# Patient Record
Sex: Female | Born: 1935 | Race: White | Hispanic: No | Smoking: Former smoker
Health system: Southern US, Community
[De-identification: ages and names within clinical notes are randomized; demographics above are authoritative.]

## PROBLEM LIST (undated history)

## (undated) DIAGNOSIS — R0602 Shortness of breath: Secondary | ICD-10-CM

## (undated) DIAGNOSIS — D689 Coagulation defect, unspecified: Secondary | ICD-10-CM

## (undated) DIAGNOSIS — I80299 Phlebitis and thrombophlebitis of other deep vessels of unspecified lower extremity: Secondary | ICD-10-CM

## (undated) DIAGNOSIS — R609 Edema, unspecified: Secondary | ICD-10-CM

## (undated) DIAGNOSIS — Z5189 Encounter for other specified aftercare: Secondary | ICD-10-CM

## (undated) DIAGNOSIS — F32A Depression, unspecified: Secondary | ICD-10-CM

## (undated) DIAGNOSIS — K219 Gastro-esophageal reflux disease without esophagitis: Secondary | ICD-10-CM

## (undated) DIAGNOSIS — M8448XA Pathological fracture, other site, initial encounter for fracture: Secondary | ICD-10-CM

## (undated) DIAGNOSIS — M6281 Muscle weakness (generalized): Secondary | ICD-10-CM

## (undated) DIAGNOSIS — S22009A Unspecified fracture of unspecified thoracic vertebra, initial encounter for closed fracture: Secondary | ICD-10-CM

## (undated) DIAGNOSIS — R9431 Abnormal electrocardiogram [ECG] [EKG]: Secondary | ICD-10-CM

## (undated) DIAGNOSIS — G20 Parkinson's disease: Secondary | ICD-10-CM

## (undated) DIAGNOSIS — IMO0001 Reserved for inherently not codable concepts without codable children: Secondary | ICD-10-CM

## (undated) DIAGNOSIS — T790XXA Air embolism (traumatic), initial encounter: Secondary | ICD-10-CM

## (undated) DIAGNOSIS — K2100 Gastro-esophageal reflux disease with esophagitis, without bleeding: Secondary | ICD-10-CM

## (undated) DIAGNOSIS — R21 Rash and other nonspecific skin eruption: Secondary | ICD-10-CM

## (undated) DIAGNOSIS — J449 Chronic obstructive pulmonary disease, unspecified: Secondary | ICD-10-CM

## (undated) DIAGNOSIS — R1312 Dysphagia, oropharyngeal phase: Secondary | ICD-10-CM

## (undated) DIAGNOSIS — M810 Age-related osteoporosis without current pathological fracture: Secondary | ICD-10-CM

## (undated) DIAGNOSIS — F329 Major depressive disorder, single episode, unspecified: Secondary | ICD-10-CM

## (undated) DIAGNOSIS — I10 Essential (primary) hypertension: Secondary | ICD-10-CM

## (undated) DIAGNOSIS — S2249XA Multiple fractures of ribs, unspecified side, initial encounter for closed fracture: Secondary | ICD-10-CM

## (undated) DIAGNOSIS — N950 Postmenopausal bleeding: Secondary | ICD-10-CM

## (undated) DIAGNOSIS — E785 Hyperlipidemia, unspecified: Secondary | ICD-10-CM

## (undated) DIAGNOSIS — R0902 Hypoxemia: Secondary | ICD-10-CM

## (undated) DIAGNOSIS — R5381 Other malaise: Secondary | ICD-10-CM

## (undated) DIAGNOSIS — K4090 Unilateral inguinal hernia, without obstruction or gangrene, not specified as recurrent: Secondary | ICD-10-CM

## (undated) DIAGNOSIS — S2239XA Fracture of one rib, unspecified side, initial encounter for closed fracture: Secondary | ICD-10-CM

## (undated) DIAGNOSIS — G4700 Insomnia, unspecified: Secondary | ICD-10-CM

## (undated) DIAGNOSIS — K6389 Other specified diseases of intestine: Secondary | ICD-10-CM

## (undated) DIAGNOSIS — C189 Malignant neoplasm of colon, unspecified: Secondary | ICD-10-CM

## (undated) DIAGNOSIS — J189 Pneumonia, unspecified organism: Secondary | ICD-10-CM

## (undated) DIAGNOSIS — T4145XA Adverse effect of unspecified anesthetic, initial encounter: Secondary | ICD-10-CM

## (undated) DIAGNOSIS — H5450 Low vision, one eye, unspecified eye: Secondary | ICD-10-CM

## (undated) DIAGNOSIS — G629 Polyneuropathy, unspecified: Secondary | ICD-10-CM

## (undated) DIAGNOSIS — R269 Unspecified abnormalities of gait and mobility: Secondary | ICD-10-CM

## (undated) DIAGNOSIS — T8859XA Other complications of anesthesia, initial encounter: Secondary | ICD-10-CM

## (undated) DIAGNOSIS — M1990 Unspecified osteoarthritis, unspecified site: Secondary | ICD-10-CM

## (undated) DIAGNOSIS — K5900 Constipation, unspecified: Secondary | ICD-10-CM

## (undated) DIAGNOSIS — R32 Unspecified urinary incontinence: Secondary | ICD-10-CM

## (undated) DIAGNOSIS — E039 Hypothyroidism, unspecified: Secondary | ICD-10-CM

## (undated) DIAGNOSIS — I2699 Other pulmonary embolism without acute cor pulmonale: Secondary | ICD-10-CM

## (undated) DIAGNOSIS — N824 Other female intestinal-genital tract fistulae: Secondary | ICD-10-CM

## (undated) DIAGNOSIS — R5383 Other fatigue: Secondary | ICD-10-CM

## (undated) DIAGNOSIS — Z7901 Long term (current) use of anticoagulants: Secondary | ICD-10-CM

## (undated) DIAGNOSIS — R233 Spontaneous ecchymoses: Secondary | ICD-10-CM

## (undated) DIAGNOSIS — E079 Disorder of thyroid, unspecified: Secondary | ICD-10-CM

## (undated) DIAGNOSIS — M25569 Pain in unspecified knee: Secondary | ICD-10-CM

## (undated) DIAGNOSIS — G20A1 Parkinson's disease without dyskinesia, without mention of fluctuations: Secondary | ICD-10-CM

## (undated) DIAGNOSIS — Z79899 Other long term (current) drug therapy: Secondary | ICD-10-CM

## (undated) DIAGNOSIS — J301 Allergic rhinitis due to pollen: Secondary | ICD-10-CM

## (undated) DIAGNOSIS — M461 Sacroiliitis, not elsewhere classified: Secondary | ICD-10-CM

## (undated) DIAGNOSIS — I509 Heart failure, unspecified: Secondary | ICD-10-CM

## (undated) DIAGNOSIS — I4891 Unspecified atrial fibrillation: Secondary | ICD-10-CM

## (undated) DIAGNOSIS — M40209 Unspecified kyphosis, site unspecified: Secondary | ICD-10-CM

## (undated) HISTORY — DX: Other long term (current) drug therapy: Z79.899

## (undated) HISTORY — PX: RETINAL TEAR REPAIR CRYOTHERAPY: SHX5304

## (undated) HISTORY — DX: Postmenopausal bleeding: N95.0

## (undated) HISTORY — DX: Other specified diseases of intestine: K63.89

## (undated) HISTORY — DX: Malignant neoplasm of colon, unspecified: C18.9

## (undated) HISTORY — PX: NASAL SINUS SURGERY: SHX719

## (undated) HISTORY — PX: PLANTAR FASCIA SURGERY: SHX746

## (undated) HISTORY — PX: PRESSURE ULCER DEBRIDEMENT: SHX750

## (undated) HISTORY — PX: TONSILLECTOMY AND ADENOIDECTOMY: SUR1326

## (undated) HISTORY — PX: BACK SURGERY: SHX140

## (undated) HISTORY — DX: Other female intestinal-genital tract fistulae: N82.4

## (undated) HISTORY — PX: CATARACT EXTRACTION, BILATERAL: SHX1313

---

## 1968-06-28 HISTORY — PX: BREAST SURGERY: SHX581

## 1998-03-06 ENCOUNTER — Other Ambulatory Visit: Admission: RE | Admit: 1998-03-06 | Discharge: 1998-03-06 | Payer: Self-pay | Admitting: Internal Medicine

## 1999-06-29 HISTORY — PX: BLADDER SUSPENSION: SHX72

## 1999-10-04 ENCOUNTER — Encounter: Payer: Self-pay | Admitting: Emergency Medicine

## 1999-10-04 ENCOUNTER — Emergency Department (HOSPITAL_COMMUNITY): Admission: EM | Admit: 1999-10-04 | Discharge: 1999-10-04 | Payer: Self-pay | Admitting: Emergency Medicine

## 1999-10-12 ENCOUNTER — Encounter: Payer: Self-pay | Admitting: Internal Medicine

## 1999-10-12 ENCOUNTER — Ambulatory Visit (HOSPITAL_COMMUNITY): Admission: RE | Admit: 1999-10-12 | Discharge: 1999-10-12 | Payer: Self-pay | Admitting: Internal Medicine

## 2000-01-25 ENCOUNTER — Inpatient Hospital Stay (HOSPITAL_COMMUNITY): Admission: RE | Admit: 2000-01-25 | Discharge: 2000-01-30 | Payer: Self-pay | Admitting: Urology

## 2000-08-01 ENCOUNTER — Inpatient Hospital Stay (HOSPITAL_COMMUNITY): Admission: AD | Admit: 2000-08-01 | Discharge: 2000-08-04 | Payer: Self-pay | Admitting: Internal Medicine

## 2001-03-02 ENCOUNTER — Encounter (INDEPENDENT_AMBULATORY_CARE_PROVIDER_SITE_OTHER): Payer: Self-pay | Admitting: Specialist

## 2001-03-02 ENCOUNTER — Ambulatory Visit (HOSPITAL_COMMUNITY): Admission: RE | Admit: 2001-03-02 | Discharge: 2001-03-02 | Payer: Self-pay | Admitting: *Deleted

## 2002-01-18 ENCOUNTER — Encounter: Admission: RE | Admit: 2002-01-18 | Discharge: 2002-01-18 | Payer: Self-pay | Admitting: Otolaryngology

## 2002-01-18 ENCOUNTER — Encounter: Payer: Self-pay | Admitting: Otolaryngology

## 2002-01-19 ENCOUNTER — Ambulatory Visit (HOSPITAL_BASED_OUTPATIENT_CLINIC_OR_DEPARTMENT_OTHER): Admission: RE | Admit: 2002-01-19 | Discharge: 2002-01-19 | Payer: Self-pay | Admitting: Otolaryngology

## 2002-01-19 ENCOUNTER — Encounter (INDEPENDENT_AMBULATORY_CARE_PROVIDER_SITE_OTHER): Payer: Self-pay | Admitting: *Deleted

## 2002-05-12 ENCOUNTER — Inpatient Hospital Stay (HOSPITAL_COMMUNITY): Admission: EM | Admit: 2002-05-12 | Discharge: 2002-05-13 | Payer: Self-pay | Admitting: Emergency Medicine

## 2002-06-28 HISTORY — PX: APPENDECTOMY: SHX54

## 2002-10-15 ENCOUNTER — Inpatient Hospital Stay (HOSPITAL_COMMUNITY): Admission: RE | Admit: 2002-10-15 | Discharge: 2002-10-25 | Payer: Self-pay | Admitting: Internal Medicine

## 2002-10-15 ENCOUNTER — Encounter (HOSPITAL_BASED_OUTPATIENT_CLINIC_OR_DEPARTMENT_OTHER): Payer: Self-pay | Admitting: Internal Medicine

## 2002-10-16 ENCOUNTER — Encounter (INDEPENDENT_AMBULATORY_CARE_PROVIDER_SITE_OTHER): Payer: Self-pay | Admitting: Specialist

## 2003-03-12 ENCOUNTER — Emergency Department (HOSPITAL_COMMUNITY): Admission: EM | Admit: 2003-03-12 | Discharge: 2003-03-12 | Payer: Self-pay | Admitting: Emergency Medicine

## 2003-03-12 ENCOUNTER — Encounter: Payer: Self-pay | Admitting: Emergency Medicine

## 2003-09-18 ENCOUNTER — Ambulatory Visit (HOSPITAL_COMMUNITY): Admission: RE | Admit: 2003-09-18 | Discharge: 2003-09-18 | Payer: Self-pay | Admitting: Internal Medicine

## 2003-10-01 ENCOUNTER — Ambulatory Visit (HOSPITAL_COMMUNITY): Admission: RE | Admit: 2003-10-01 | Discharge: 2003-10-01 | Payer: Self-pay | Admitting: Internal Medicine

## 2004-10-06 ENCOUNTER — Ambulatory Visit (HOSPITAL_COMMUNITY): Admission: RE | Admit: 2004-10-06 | Discharge: 2004-10-06 | Payer: Self-pay | Admitting: Orthopedic Surgery

## 2005-11-23 ENCOUNTER — Ambulatory Visit (HOSPITAL_COMMUNITY): Admission: RE | Admit: 2005-11-23 | Discharge: 2005-11-23 | Payer: Self-pay | Admitting: Internal Medicine

## 2006-10-05 ENCOUNTER — Ambulatory Visit (HOSPITAL_COMMUNITY): Admission: RE | Admit: 2006-10-05 | Discharge: 2006-10-05 | Payer: Self-pay | Admitting: Internal Medicine

## 2006-10-12 ENCOUNTER — Encounter: Payer: Self-pay | Admitting: Internal Medicine

## 2006-10-13 ENCOUNTER — Ambulatory Visit (HOSPITAL_COMMUNITY): Admission: RE | Admit: 2006-10-13 | Discharge: 2006-10-13 | Payer: Self-pay | Admitting: Interventional Radiology

## 2006-10-13 ENCOUNTER — Encounter (INDEPENDENT_AMBULATORY_CARE_PROVIDER_SITE_OTHER): Payer: Self-pay | Admitting: *Deleted

## 2006-10-14 ENCOUNTER — Inpatient Hospital Stay (HOSPITAL_COMMUNITY): Admission: EM | Admit: 2006-10-14 | Discharge: 2006-10-21 | Payer: Self-pay | Admitting: Emergency Medicine

## 2006-11-03 ENCOUNTER — Encounter: Payer: Self-pay | Admitting: Interventional Radiology

## 2007-02-08 ENCOUNTER — Ambulatory Visit: Admission: RE | Admit: 2007-02-08 | Discharge: 2007-02-08 | Payer: Self-pay | Admitting: Internal Medicine

## 2008-01-04 ENCOUNTER — Ambulatory Visit (HOSPITAL_COMMUNITY): Admission: RE | Admit: 2008-01-04 | Discharge: 2008-01-04 | Payer: Self-pay | Admitting: Internal Medicine

## 2008-02-01 ENCOUNTER — Encounter: Admission: RE | Admit: 2008-02-01 | Discharge: 2008-02-01 | Payer: Self-pay | Admitting: Neurology

## 2008-11-26 DIAGNOSIS — I2699 Other pulmonary embolism without acute cor pulmonale: Secondary | ICD-10-CM

## 2008-11-26 HISTORY — DX: Other pulmonary embolism without acute cor pulmonale: I26.99

## 2008-12-02 ENCOUNTER — Inpatient Hospital Stay (HOSPITAL_COMMUNITY): Admission: EM | Admit: 2008-12-02 | Discharge: 2008-12-10 | Payer: Self-pay | Admitting: Emergency Medicine

## 2008-12-02 ENCOUNTER — Ambulatory Visit: Payer: Self-pay | Admitting: Cardiology

## 2008-12-03 ENCOUNTER — Encounter (INDEPENDENT_AMBULATORY_CARE_PROVIDER_SITE_OTHER): Payer: Self-pay | Admitting: Internal Medicine

## 2008-12-03 ENCOUNTER — Ambulatory Visit: Payer: Self-pay | Admitting: Vascular Surgery

## 2008-12-21 ENCOUNTER — Emergency Department (HOSPITAL_COMMUNITY): Admission: EM | Admit: 2008-12-21 | Discharge: 2008-12-22 | Payer: Self-pay | Admitting: Emergency Medicine

## 2009-10-09 ENCOUNTER — Inpatient Hospital Stay (HOSPITAL_COMMUNITY): Admission: EM | Admit: 2009-10-09 | Discharge: 2009-10-16 | Payer: Self-pay | Admitting: Emergency Medicine

## 2009-10-11 ENCOUNTER — Encounter: Payer: Self-pay | Admitting: Internal Medicine

## 2009-10-13 ENCOUNTER — Encounter (INDEPENDENT_AMBULATORY_CARE_PROVIDER_SITE_OTHER): Payer: Self-pay | Admitting: Internal Medicine

## 2009-10-13 DIAGNOSIS — M40209 Unspecified kyphosis, site unspecified: Secondary | ICD-10-CM

## 2009-10-13 HISTORY — DX: Unspecified kyphosis, site unspecified: M40.209

## 2009-10-17 DIAGNOSIS — R5381 Other malaise: Secondary | ICD-10-CM

## 2009-10-17 HISTORY — DX: Other malaise: R53.81

## 2009-12-06 IMAGING — CT CT HEAD W/O CM
1 of 2 series · 16 of 30 positions shown, 20 images · non-contrast
Comparison: 02/01/2008

CLINICAL DATA: Fall, hit head.

CT HEAD WITHOUT CONTRAST
TECHNIQUE: Contiguous axial images were obtained from the base of
the skull through the vertex without contrast.

[Series 3: recon 2: brain · axial · 0.49mm/px · z∈[+135,+265]mm · 16 of 56 slices shown, 20 images]
[im 3/56  brain]
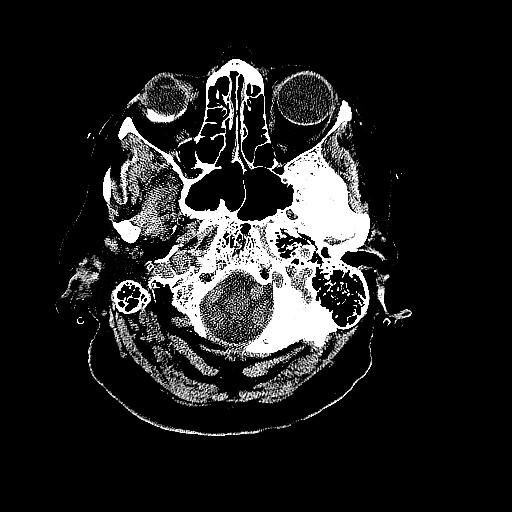
[im 3/56  bone]
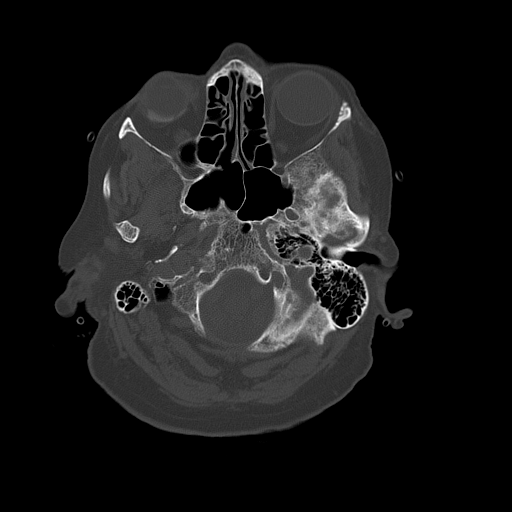
[im 6/56  brain]
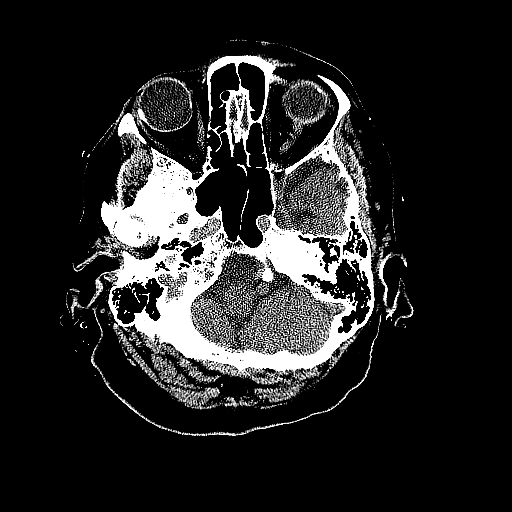
[im 9/56  brain]
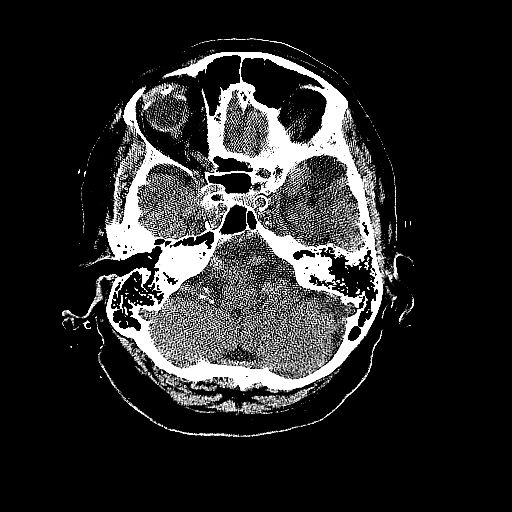
[im 12/56  brain]
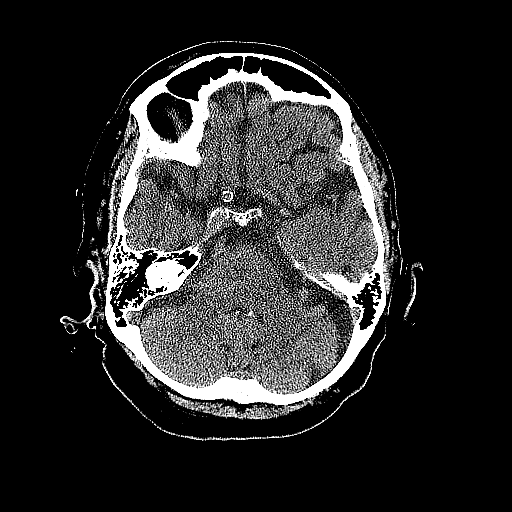
[im 18/56  brain]
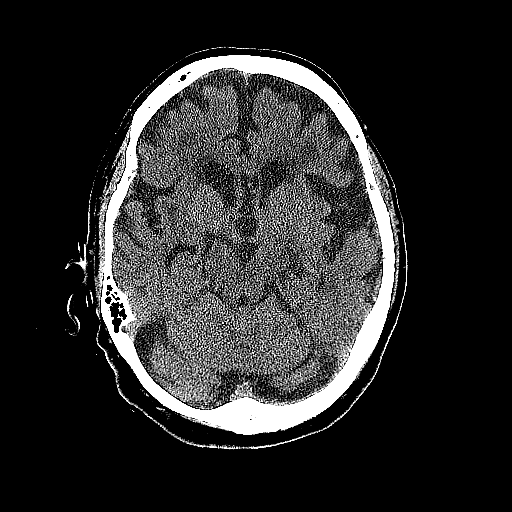
[im 18/56  bone]
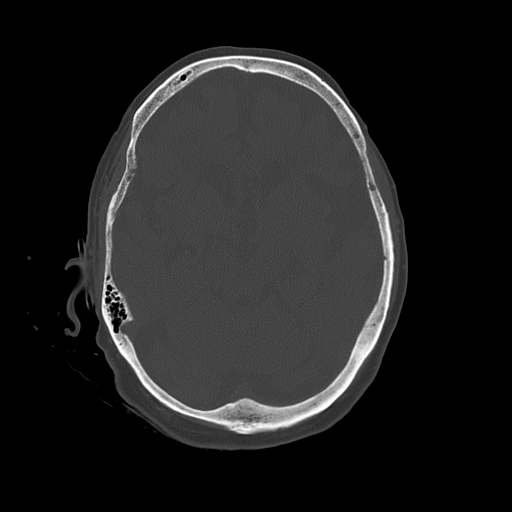
[im 21/56  brain]
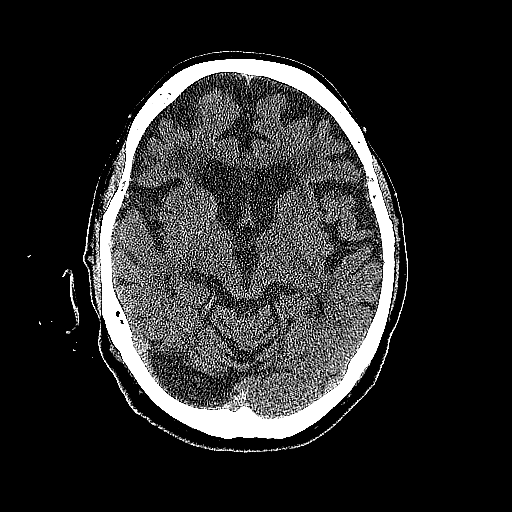
[im 24/56  brain]
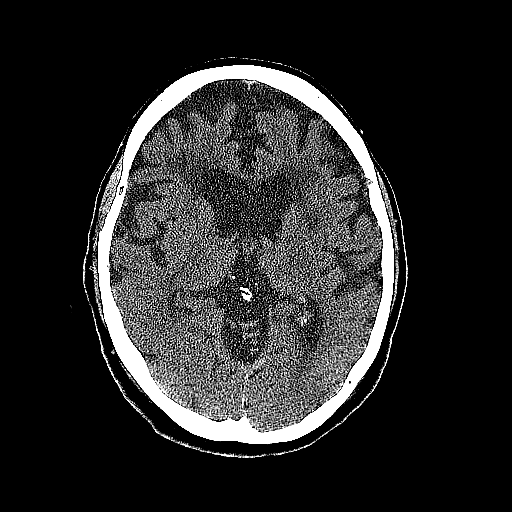
[im 27/56  brain]
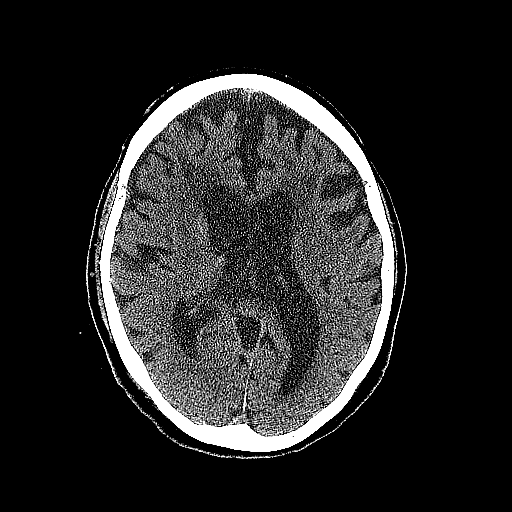
[im 29/56  brain]
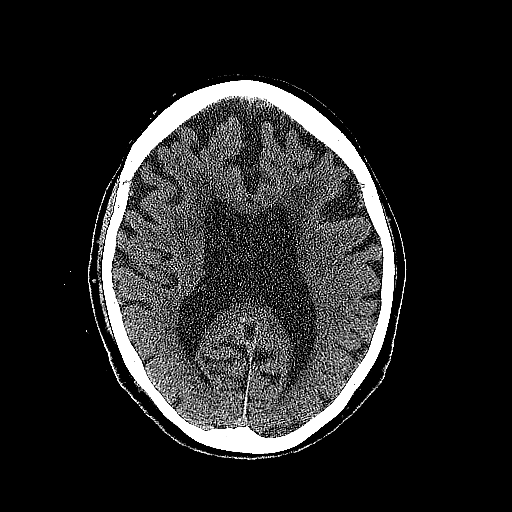
[im 29/56  bone]
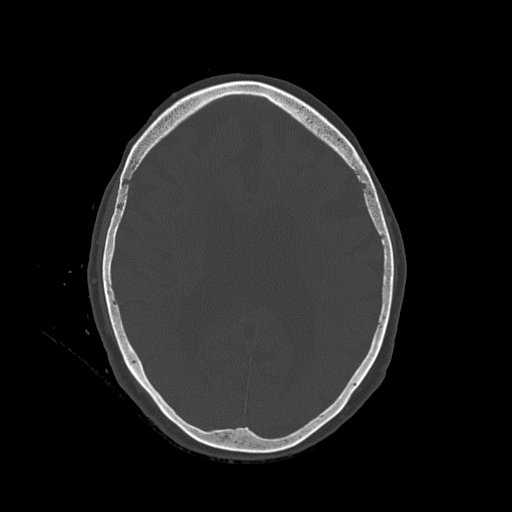
[im 32/56  brain]
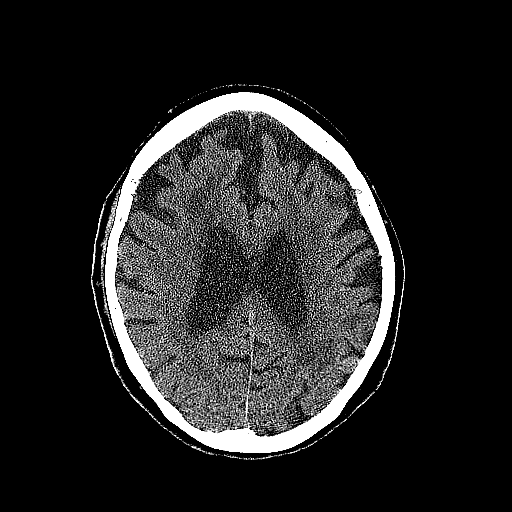
[im 35/56  brain]
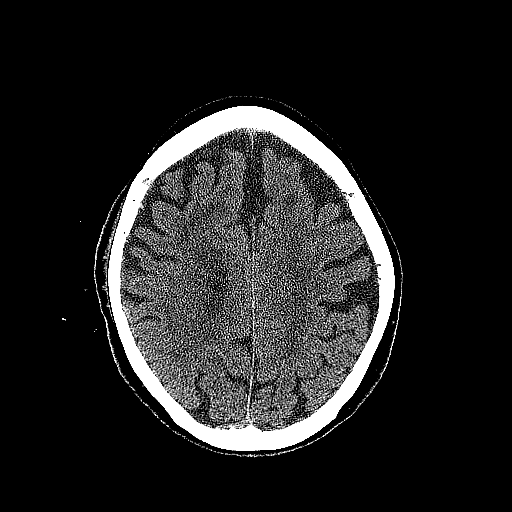
[im 38/56  brain]
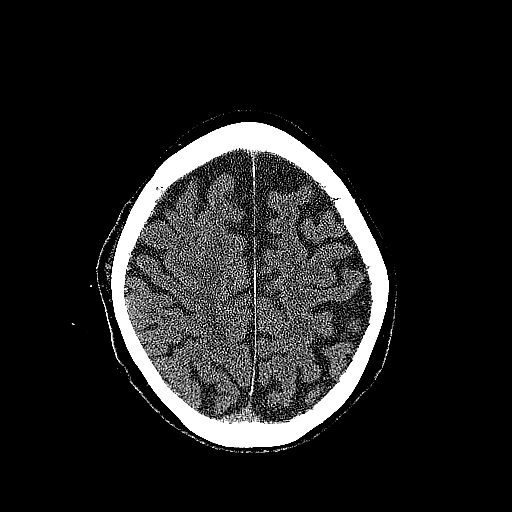
[im 44/56  brain]
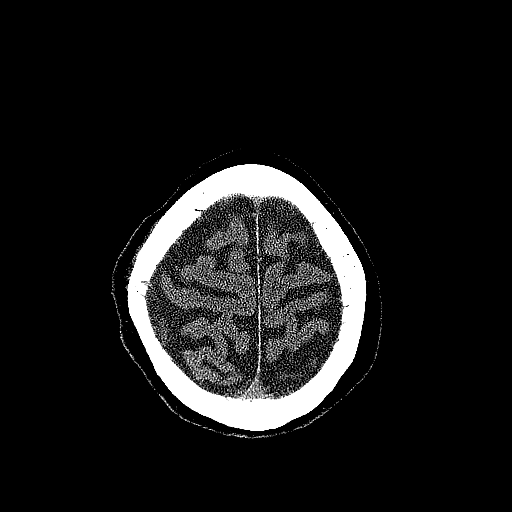
[im 44/56  bone]
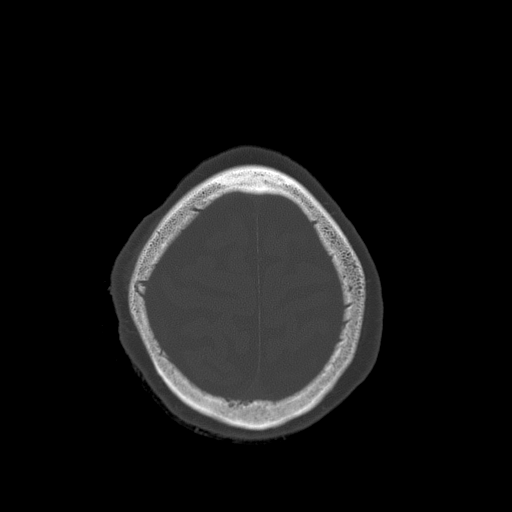
[im 47/56  brain]
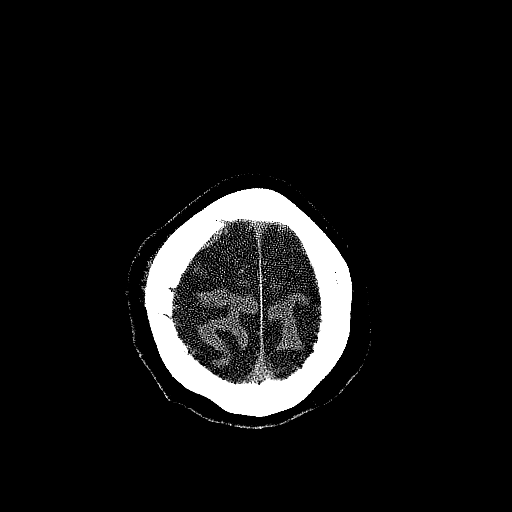
[im 50/56  brain]
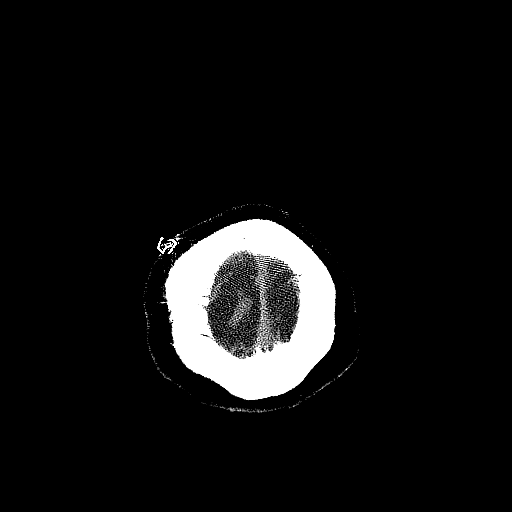
[im 53/56  brain]
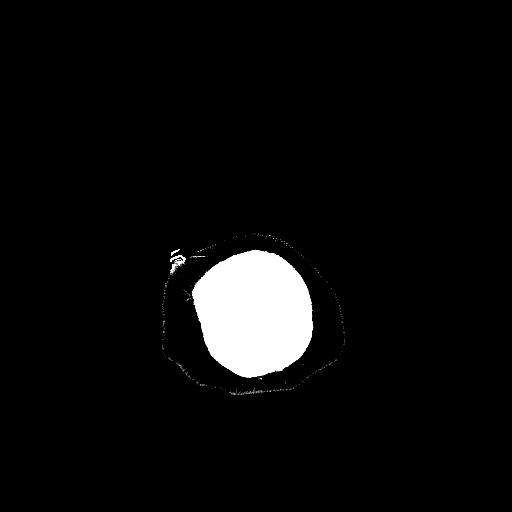

[16 of 30 positions shown; findings below may reference images not displayed]

FINDINGS: Severe atrophy and chronic small vessel disease changes
throughout the deep white matter.  Associated ventriculomegaly. No
acute intracranial abnormality.  Specifically, no hemorrhage,
hydrocephalus, mass lesion, acute infarction, or significant
intracranial injury.  No acute calvarial abnormality.
IMPRESSION: No acute intracranial abnormality.

Severe atrophy, chronic small vessel disease.

## 2010-09-15 LAB — COMPREHENSIVE METABOLIC PANEL
ALT: <8 U/L (ref 0–35)
Alkaline Phosphatase: 57 U/L (ref 39–117)
BUN: 17 mg/dL (ref 6–23)
CO2: 27 mEq/L (ref 19–32)
Calcium: 8.9 mg/dL (ref 8.4–10.5)
GFR calc non Af Amer: 39 mL/min — ABNORMAL LOW (ref >60)
Glucose, Bld: 109 mg/dL — ABNORMAL HIGH (ref 70–99)
Potassium: 5 mEq/L (ref 3.5–5.1)
Total Protein: 6.2 g/dL (ref 6.0–8.3)

## 2010-09-15 LAB — CARDIAC PANEL(CRET KIN+CKTOT+MB+TROPI)
CK, MB: 2.6 ng/mL (ref 0.3–4.0)
CK, MB: 3 ng/mL (ref 0.3–4.0)
Relative Index: 1.5 (ref 0.0–2.5)
Total CK: 151 U/L (ref 7–177)
Total CK: 177 U/L (ref 7–177)
Troponin I: 0.03 ng/mL (ref 0.00–0.06)

## 2010-09-15 LAB — DIFFERENTIAL
Basophils Absolute: 0 10*3/uL (ref 0.0–0.1)
Basophils Relative: 0 % (ref 0–1)
Lymphocytes Relative: 9 % — ABNORMAL LOW (ref 12–46)
Monocytes Relative: 1 % — ABNORMAL LOW (ref 3–12)
Neutro Abs: 4.9 10*3/uL (ref 1.7–7.7)
Neutrophils Relative %: 90 % — ABNORMAL HIGH (ref 43–77)

## 2010-09-15 LAB — PROTIME-INR
INR: 1.12 (ref 0.00–1.49)
INR: 1.17 (ref 0.00–1.49)
INR: 1.29 (ref 0.00–1.49)
INR: 3.14 — ABNORMAL HIGH (ref 0.00–1.49)
Prothrombin Time: 14.3 seconds (ref 11.6–15.2)
Prothrombin Time: 16 seconds — ABNORMAL HIGH (ref 11.6–15.2)
Prothrombin Time: 22.2 seconds — ABNORMAL HIGH (ref 11.6–15.2)
Prothrombin Time: 32 seconds — ABNORMAL HIGH (ref 11.6–15.2)

## 2010-09-15 LAB — CBC
HCT: 33.4 % — ABNORMAL LOW (ref 36.0–46.0)
HCT: 35.2 % — ABNORMAL LOW (ref 36.0–46.0)
Hemoglobin: 12 g/dL (ref 12.0–15.0)
MCHC: 33.4 g/dL (ref 30.0–36.0)
MCHC: 33.8 g/dL (ref 30.0–36.0)
MCHC: 34 g/dL (ref 30.0–36.0)
MCHC: 34.4 g/dL (ref 30.0–36.0)
MCV: 82.6 fL (ref 78.0–100.0)
MCV: 82.9 fL (ref 78.0–100.0)
Platelets: 143 10*3/uL — ABNORMAL LOW (ref 150–400)
Platelets: 222 10*3/uL (ref 150–400)
RBC: 3.87 MIL/uL (ref 3.87–5.11)
RBC: 3.97 MIL/uL (ref 3.87–5.11)
RBC: 4.25 MIL/uL (ref 3.87–5.11)
RDW: 17.3 % — ABNORMAL HIGH (ref 11.5–15.5)
RDW: 17.3 % — ABNORMAL HIGH (ref 11.5–15.5)
RDW: 17.6 % — ABNORMAL HIGH (ref 11.5–15.5)
RDW: 17.6 % — ABNORMAL HIGH (ref 11.5–15.5)
RDW: 18 % — ABNORMAL HIGH (ref 11.5–15.5)
WBC: 6.8 10*3/uL (ref 4.0–10.5)
WBC: 9.7 10*3/uL (ref 4.0–10.5)

## 2010-09-15 LAB — BASIC METABOLIC PANEL
BUN: 27 mg/dL — ABNORMAL HIGH (ref 6–23)
CO2: 28 mEq/L (ref 19–32)
Calcium: 8.4 mg/dL (ref 8.4–10.5)
Calcium: 8.8 mg/dL (ref 8.4–10.5)
Creatinine, Ser: 1.06 mg/dL (ref 0.4–1.2)
GFR calc Af Amer: 59 mL/min — ABNORMAL LOW (ref >60)
GFR calc non Af Amer: 41 mL/min — ABNORMAL LOW (ref >60)
GFR calc non Af Amer: 49 mL/min — ABNORMAL LOW (ref >60)
Glucose, Bld: 106 mg/dL — ABNORMAL HIGH (ref 70–99)
Sodium: 138 mEq/L (ref 135–145)

## 2010-09-15 LAB — BLOOD GAS, ARTERIAL
Acid-Base Excess: 0.9 mmol/L (ref 0.0–2.0)
Bicarbonate: 25.2 mEq/L — ABNORMAL HIGH (ref 20.0–24.0)
O2 Saturation: 98.4 %
Patient temperature: 98.6
TCO2: 26.5 mmol/L (ref 0–100)
pO2, Arterial: 80.9 mmHg (ref 80.0–100.0)

## 2010-09-16 LAB — DIFFERENTIAL
Basophils Absolute: 0.1 10*3/uL (ref 0.0–0.1)
Lymphocytes Relative: 9 % — ABNORMAL LOW (ref 12–46)
Monocytes Absolute: 0.5 10*3/uL (ref 0.1–1.0)
Neutro Abs: 12.3 10*3/uL — ABNORMAL HIGH (ref 1.7–7.7)

## 2010-09-16 LAB — BASIC METABOLIC PANEL
Calcium: 9.1 mg/dL (ref 8.4–10.5)
GFR calc non Af Amer: 38 mL/min — ABNORMAL LOW (ref >60)
Glucose, Bld: 134 mg/dL — ABNORMAL HIGH (ref 70–99)
Sodium: 144 mEq/L (ref 135–145)

## 2010-09-16 LAB — URINALYSIS, ROUTINE W REFLEX MICROSCOPIC
Glucose, UA: NEGATIVE mg/dL
Ketones, ur: NEGATIVE mg/dL
Nitrite: NEGATIVE
Protein, ur: NEGATIVE mg/dL
Urobilinogen, UA: 0.2 mg/dL (ref 0.0–1.0)

## 2010-09-16 LAB — BRAIN NATRIURETIC PEPTIDE: Pro B Natriuretic peptide (BNP): 58.3 pg/mL (ref 0.0–100.0)

## 2010-09-16 LAB — URINE CULTURE

## 2010-09-16 LAB — CBC
Hemoglobin: 12.9 g/dL (ref 12.0–15.0)
Platelets: 206 10*3/uL (ref 150–400)
RDW: 17.3 % — ABNORMAL HIGH (ref 11.5–15.5)

## 2010-09-16 LAB — PROTIME-INR: Prothrombin Time: 33.1 seconds — ABNORMAL HIGH (ref 11.6–15.2)

## 2010-09-16 LAB — TSH: TSH: 1.592 u[IU]/mL (ref 0.350–4.500)

## 2010-10-05 LAB — CBC
HCT: 32.8 % — ABNORMAL LOW (ref 36.0–46.0)
HCT: 33.4 % — ABNORMAL LOW (ref 36.0–46.0)
HCT: 34 % — ABNORMAL LOW (ref 36.0–46.0)
HCT: 38.2 % (ref 36.0–46.0)
Hemoglobin: 11.1 g/dL — ABNORMAL LOW (ref 12.0–15.0)
Hemoglobin: 11.3 g/dL — ABNORMAL LOW (ref 12.0–15.0)
Hemoglobin: 11.4 g/dL — ABNORMAL LOW (ref 12.0–15.0)
Hemoglobin: 11.7 g/dL — ABNORMAL LOW (ref 12.0–15.0)
Hemoglobin: 13 g/dL (ref 12.0–15.0)
Hemoglobin: 13.4 g/dL (ref 12.0–15.0)
MCHC: 33.5 g/dL (ref 30.0–36.0)
MCHC: 33.7 g/dL (ref 30.0–36.0)
MCV: 90.9 fL (ref 78.0–100.0)
MCV: 91 fL (ref 78.0–100.0)
MCV: 92.1 fL (ref 78.0–100.0)
Platelets: 271 10*3/uL (ref 150–400)
RBC: 3.67 MIL/uL — ABNORMAL LOW (ref 3.87–5.11)
RBC: 3.71 MIL/uL — ABNORMAL LOW (ref 3.87–5.11)
RBC: 3.71 MIL/uL — ABNORMAL LOW (ref 3.87–5.11)
RBC: 4.33 MIL/uL (ref 3.87–5.11)
RDW: 15.6 % — ABNORMAL HIGH (ref 11.5–15.5)
RDW: 15.7 % — ABNORMAL HIGH (ref 11.5–15.5)
WBC: 11.7 10*3/uL — ABNORMAL HIGH (ref 4.0–10.5)
WBC: 11.8 10*3/uL — ABNORMAL HIGH (ref 4.0–10.5)
WBC: 7.4 10*3/uL (ref 4.0–10.5)
WBC: 8.3 10*3/uL (ref 4.0–10.5)

## 2010-10-05 LAB — DIFFERENTIAL
Basophils Absolute: 0 10*3/uL (ref 0.0–0.1)
Eosinophils Relative: 0 % (ref 0–5)
Lymphocytes Relative: 13 % (ref 12–46)
Lymphs Abs: 1.6 10*3/uL (ref 0.7–4.0)
Neutro Abs: 9.6 10*3/uL — ABNORMAL HIGH (ref 1.7–7.7)

## 2010-10-05 LAB — COMPREHENSIVE METABOLIC PANEL
ALT: 20 U/L (ref 0–35)
Alkaline Phosphatase: 60 U/L (ref 39–117)
CO2: 27 mEq/L (ref 19–32)
GFR calc non Af Amer: 32 mL/min — ABNORMAL LOW (ref >60)
Glucose, Bld: 167 mg/dL — ABNORMAL HIGH (ref 70–99)
Potassium: 4.4 mEq/L (ref 3.5–5.1)
Sodium: 143 mEq/L (ref 135–145)
Total Protein: 6.9 g/dL (ref 6.0–8.3)

## 2010-10-05 LAB — BASIC METABOLIC PANEL
BUN: 18 mg/dL (ref 6–23)
BUN: 9 mg/dL (ref 6–23)
CO2: 24 mEq/L (ref 19–32)
Calcium: 8.5 mg/dL (ref 8.4–10.5)
Chloride: 109 mEq/L (ref 96–112)
Creatinine, Ser: 1.33 mg/dL — ABNORMAL HIGH (ref 0.4–1.2)
GFR calc Af Amer: 50 mL/min — ABNORMAL LOW (ref >60)
GFR calc Af Amer: 58 mL/min — ABNORMAL LOW (ref >60)
GFR calc non Af Amer: 37 mL/min — ABNORMAL LOW (ref >60)
GFR calc non Af Amer: 41 mL/min — ABNORMAL LOW (ref >60)
Glucose, Bld: 184 mg/dL — ABNORMAL HIGH (ref 70–99)
Potassium: 4 mEq/L (ref 3.5–5.1)
Potassium: 4 mEq/L (ref 3.5–5.1)
Sodium: 141 mEq/L (ref 135–145)
Sodium: 143 mEq/L (ref 135–145)

## 2010-10-05 LAB — URINALYSIS, ROUTINE W REFLEX MICROSCOPIC
Bilirubin Urine: NEGATIVE
Glucose, UA: NEGATIVE mg/dL
Specific Gravity, Urine: 1.018 (ref 1.005–1.030)
pH: 5 (ref 5.0–8.0)

## 2010-10-05 LAB — CARDIAC PANEL(CRET KIN+CKTOT+MB+TROPI)
Relative Index: 2.2 (ref 0.0–2.5)
Troponin I: 0.03 ng/mL (ref 0.00–0.06)

## 2010-10-05 LAB — LIPID PANEL
Cholesterol: 187 mg/dL (ref 0–200)
HDL: 49 mg/dL (ref >39)
LDL Cholesterol: 123 mg/dL — ABNORMAL HIGH (ref 0–99)
Total CHOL/HDL Ratio: 3.8 RATIO
Triglycerides: 77 mg/dL (ref <150)
VLDL: 15 mg/dL (ref 0–40)

## 2010-10-05 LAB — HEMOGLOBIN A1C: Mean Plasma Glucose: 123 mg/dL

## 2010-10-05 LAB — PROTIME-INR
INR: 1.1 (ref 0.00–1.49)
INR: 2 — ABNORMAL HIGH (ref 0.00–1.49)
INR: 2.4 — ABNORMAL HIGH (ref 0.00–1.49)
Prothrombin Time: 13.2 seconds (ref 11.6–15.2)
Prothrombin Time: 13.8 seconds (ref 11.6–15.2)
Prothrombin Time: 24 seconds — ABNORMAL HIGH (ref 11.6–15.2)
Prothrombin Time: 27.3 seconds — ABNORMAL HIGH (ref 11.6–15.2)

## 2010-10-05 LAB — CK TOTAL AND CKMB (NOT AT ARMC)
CK, MB: 4 ng/mL (ref 0.3–4.0)
Total CK: 100 U/L (ref 7–177)

## 2010-10-05 LAB — CULTURE, BLOOD (ROUTINE X 2)
Culture: NO GROWTH
Culture: NO GROWTH

## 2010-10-05 LAB — URINE CULTURE: Culture: NO GROWTH

## 2010-10-05 LAB — URINE MICROSCOPIC-ADD ON

## 2010-10-05 LAB — APTT: aPTT: 26 seconds (ref 24–37)

## 2010-11-10 NOTE — Discharge Summary (Signed)
Stacey Mccarthy, Stacey Mccarthy            ACCOUNT NO.:  [id number]   MEDICAL RECORD NO.:  [id number]          PATIENT TYPE:  INP   LOCATION:  1403                         FACILITY:  [facility]   PHYSICIAN:  Michelene Gardener, MD    DATE OF BIRTH:  [birth year]   DATE OF ADMISSION:  12/02/2008  DATE OF DISCHARGE:  12/10/2008                               DISCHARGE SUMMARY   DISCHARGE DIAGNOSES:  Include  1. Bilateral pulmonary embolism.  2. Left lower lobe pneumonia.  3. Hypoxia secondary to #1 and #2.  4. Hypertension.  5. Hyperlipidemia.  6. Hypothyroidism.  7. Gastroesophageal reflux disease.  8. History of chronic obstructive pulmonary disease without acute      exacerbation.  9. History of prior deep venous thrombosis and pulmonary embolism and      patient used to be on Coumadin, that was discontinued 2 months      prior to this hospitalization.   DISCHARGE MEDICATIONS:  1. Coumadin 6 mg p.o. once daily.  2. Avelox 400 mg p.o. once daily for 4 days.  3. Prednisone 5 mg once a day.  4. Aspirin 325 mg once a day.  5. Synthroid 88 mcg once a day.  6. Nexium 40 mg twice daily.  7. Advair 250/50 one puff twice daily.  8. Sinemet 25/100 mg twice daily.  9. Lyrica 25 mg at bedtime.  10.Albuterol inhaler 2 puffs every 4 hours as needed.  11.Actonel 70 mg on Monday.  12.Tramadol 50 mg every 4 hours as needed.  13.Oxygen via nasal cannula at 2 L.   CONSULTATIONS:  None.   PROCEDURE:  None.   DIAGNOSTIC STUDIES:  1. Chest x-ray on December 02, 2008, showed left pleural effusion with      chronic interstitial lung disease.  2. CT angiography on December 02, 2008, showed bilateral pulmonary embolism      with possible clot in the left atrium and small bilateral pleural      effusion and bibasilar atelectasis.  3. Chest x-ray on December 05, 2008, showed mild cardiomegaly.  4. Repeat chest x-ray on December 09, 2008, showed possible left lower      lobe airspace disease.   FOLLOWUP:  With primary doctor  within a week.   COURSE OF HOSPITALIZATION.:  1. Bilateral pulmonary embolism.  This patient used to be on Coumadin      for previous DVT and PE and that was discontinued because of fall      risk and easy bruises.  The patient presented with acute onset of      bilateral pulmonary embolism.  She was admitted to the hospital.      She was started on Lovenox and Coumadin.  Her Coumadin dose has      been adjusted by the pharmacy.  Patient currently has overlap of      Coumadin and Lovenox for 48 hours.  She also received her oxygen      and that has been tapered during the hospital.  She will be      discharged on 2 L of oxygen to keep her saturation above 2  L.  2. Pneumonia.  As mentioned above, her x-ray was questionable for left      lower lobe pneumonia.  She was started on p.o. Avelox in the      hospital and she will go on 4 more days of Avelox.  3. Hypoxia that was secondary to her underlying pneumonia and PE and      patient will need to go on oxygen on 2 L.   Otherwise, other medical conditions remained stable during this  hospitalization.  Physical therapy evaluation was done and patient was  recommended for skilled with rehab facility.  Patient to be discharged  in stable condition.  Would recommend her to have physical therapy.   ACTIVITY:  Physical therapy.   DIET:  Cardiac-healthy diet.   TOTAL ASSESSMENT TIME:  40 minutes.      Michelene Gardener, MD  Electronically Signed     NAE/MEDQ  D:  12/09/2008  T:  12/09/2008  Job:  [id number]

## 2010-11-10 NOTE — H&P (Signed)
[name], Mccarthy            ACCOUNT NO.:  [id number]   MEDICAL RECORD NO.:  [id number]          PATIENT TYPE:  EMS   LOCATION:  ED                           FACILITY:  [facility]   PHYSICIAN:  Peggye Pitt, M.D. DATE OF BIRTH:  [birth year]   DATE OF ADMISSION:  12/02/2008  DATE OF DISCHARGE:                              HISTORY & PHYSICAL   PRIMARY CARE PHYSICIAN:  Dr. Frederik Pear.   CHIEF COMPLAINT:  Shortness of breath and chest pain.   HISTORY OF PRESENT ILLNESS:  Stacey Mccarthy is a pleasant 77 year old  Caucasian lady who lives at [facility] here in  [place] who on Thursday, which was 5 days prior to admission, she was  out shopping with her granddaughter when suddenly she experienced severe  shortness of breath and right-sided pleuritic type chest pain that then  radiated over to left side of her chest.  She visited her primary care  physician, Dr. Chilton Si, who ordered a chest x-ray that showed evidence of  a possible left lower lobe pneumonia and prescribed her Levaquin which  she has been taking for the past 4 days.  She started to notice some  improvement.  However, this morning at 4 she woke up with severe  shortness of breath and hypoxia and was transferred to the emergency  department for further evaluation and management.  Of note, she denies  any headache, nausea, vomiting, fevers or chills although she does state  that she has had coughing with copious amounts of whitish/clear sputum.  She denies any calf pain.   ALLERGIES:  She has stated allergies to SULFA DRUGS, DOXYCYCLINE,  DIFLUCAN.   PAST MEDICAL HISTORY:  Significant for hypercholesterolemia.  Hypertension.  Hypothyroidism. GERD.  COPD.  Prior history of DVT and  pulmonary embolism on Coumadin for many years just discontinued 2 months  ago.   MEDICATIONS:  1. Include prednisone 5 mg daily.  2. Aspirin 325 mg daily.  3. Synthroid 88 mcg daily.  4. Nexium 40 mg twice daily.  5. Tylenol 500 mg twice daily.  6. Advair 250/50 mg 2 puffs twice daily.  7. Sinemet 25/100 mg twice daily.  8. Lyrica 25 mg at bedtime.  9. Albuterol 2 puffs every 4 hours as needed for shortness of breath.  10.Actonel 1 tablet on Mondays.  11.Tramadol 50 mg every 4 hours as needed for pain.  12.Levaquin 750 mg started on June 4 to continue for 5 days.   SOCIAL HISTORY:  No alcohol or illicit drug use.  She used to be a half  pack to one pack smoker.  However, she quit this in the 1970s.  She  currently lives in assisted living facility.  Has three children.  Two  of whom are present at bedside during my examination.   FAMILY HISTORY:  Noncontributory with regard to the parents.  However,  of note she has a history of a pulmonary embolism and both of her  children have had pulmonary emboli and one after a prolonged  immobilization and second child after an ankle fracture.   REVIEW OF SYSTEMS:  Negative except as already  mentioned on HPI.   PHYSICAL EXAM:  VITAL SIGNS:  Upon admission blood pressure 124/69,  heart rate 112-120, respirations 24, O2 sats 93% on 4 liters with a  temperature of 98.7.  GENERAL:  She is alert, awake, oriented x3 is in moderate distress  secondary to her shortness of breath.  She cannot speak in full  sentences before running out of air.  HEENT: She is normocephalic, atraumatic.  Her pupils are equally  reactive to light and accommodation with intact extraocular movements.  NECK:  Supple with no JVD, no lymphadenopathy, no bruits or goiter.  HEART:  Tachycardiac, however regular rhythm.  No murmurs, rubs or  gallops.  LUNGS:  She has some bibasilar crackles left greater than right.  However, no wheezing auscultated.  ABDOMEN:  Soft, nontender, nondistended with positive bowel sounds.  EXTREMITIES:  She has about 1+ pitting edema to her mid shins  bilaterally.  She has a negative Homans' sign.  She has positive pedal  pulses.  She has chronic skin color  changes consistent with venous  insufficiency.  NEUROLOGIC:  Grossly intact and nonfocal.   LABS UPON ADMISSION:  Sodium 143, potassium 4.4, chloride 102, bicarb  27, BUN 17, creatinine 1.53 with a glucose of 167.  All of her LFTs are  within normal limits with the exception of a slightly low albumin of  3.4.  WBCs 11.8, hemoglobin 13.4 and a platelet count of 193,000.  Her  INR is 1.0, PTT is 26.  Urinalysis shows 3 to 6 RBCs and WBCs with no  bacteria.  A chest x-ray shows a left pleural effusion with chronic  interstitial lung disease and remote left rib fractures.  An EKG shows a  sinus tachycardia.   ASSESSMENT AND PLAN:  1. Shortness of breath.  The differential diagnosis at this point in      time remains a pneumonia versus a pulmonary embolism versus an      acute coronary syndrome, versus chronic obstructive pulmonary      disease exacerbation.  Her Well's score for pulmonary embolism is      pretty high given her prior history of pulmonary embolism, her      tachycardia, her hypoxia, chest pain and shortness of breath as      well as a lower extremity edema.  Hence, I will send her for a CT      angiogram of the chest and if this shows a pulmonary embolism, she      will likely need to be on warfarin for the rest of her life.  In      the meantime will also check a 2-D echocardiogram and rule her out      for acute coronary syndrome by the means of 3 sets of cardiac      enzymes and likely EKGs.  For the possibility of pneumonia noted on      her prior chest x-ray will start her on Rocephin and azithromycin.      We will also panculture including blood and sputum.  2. Acute renal insufficiency with a creatinine of 1.53.  At this point      I do not know what her last normal creatinine was.  This is likely      prerenal azotemia secondary to volume contraction.  We will start      her on some mild IV fluids to monitor her creatinine.  3. Gastroesophageal reflux disease.  We  will continue her PPI.  4. For hypothyroidism, we will check a TSH and continue her home dose      of Synthroid.  5. Chronic obstructive pulmonary disease with a probable exacerbation,      we will continue her Advair.  We will place her on albuterol and      Atrovent nebs, we will continue her home dose of prednisone at this      time.  She is currently not wheezing, but she did receive a      continuous 2-hour nebulizer treatment in the ED.  We will continue      to follow for need for more nebulizer treatments.  At the time of      discharge, will consider consolidating her regimen to include      Spiriva.  6. Hypertension.  She is not currently on any medications.  Her blood      pressure is perfect.  We will monitor at this time.  7. For hyperlipidemia will check a fasting lipid panel.  She is not on      a Statin at this time.  8. For prophylaxis while in the hospital, she will be on Protonix for      gastrointestinal prophylaxis and on Lovenox for deep vein      thrombosis prophylaxis.      Peggye Pitt, M.D.  Electronically Signed     EH/MEDQ  D:  12/02/2008  T:  12/02/2008  Job:  [id number]   cc:   Lenon Curt. Chilton Si, M.D.  Fax: [phone]

## 2010-11-13 NOTE — Procedures (Signed)
[facility]  Patient:    Stacey Mccarthy, Stacey Mccarthy Visit Number: [id number] MRN: [id number]          Service Type: END Location: ENDO Attending Physician:  Sabino Gasser Proc. Date: 03/02/01 Admit Date:  03/02/2001                             Procedure Report  PROCEDURE:  Colonoscopy.  INDICATIONS:  Diarrhea, colon cancer screening.  ANESTHESIA:   Demerol 35 mg.  DESCRIPTION OF PROCEDURE:  With the patient mildly sedated in the left lateral decubitus position, the Olympus videoscopic colonoscope was inserted in the rectum and passed under direct vision to the cecum, identified by the ileocecal valve and appendiceal orifice, both of which were photographed. From this point, the colonoscope was slowly withdrawn, taking circumferential views of the entire colonic mucosa, stopping to photograph a rare diverticulum seen in the sigmoid colon, hemorrhoids on retroflex of the rectum, and a small polyp seen in the rectum which was removed using biopsy forceps technique. There were random biopsies taken along the way because of the patients chronic diarrhea.  The patients vital signs and pulse oximeter remained stable.  The patient tolerated the procedure well without apparent complications.  FINDINGS:  Small polyp of rectum, rare diverticulum of the sigmoid colon, and internal hemorrhoids.  Otherwise unremarkable examination.  PLAN:  We will have the patient will call me for the results of the biopsy and follow up with me as an outpatient. Attending Physician:  Sabino Gasser DD:  03/02/01 TD:  03/02/01 Job: [id number]

## 2010-11-13 NOTE — Discharge Summary (Signed)
[facility]  Patient:    Stacey Mccarthy, Stacey Mccarthy                   MRN: [id number] Adm. Date:  [id number] Disc. Date: [id number] Attending:  Duke Salvia Dictator:   Cornell Barman, P.A. CC:         Titus Dubin. Alwyn Ren, M.D. [facility]   Discharge Summary  DISCHARGE DIAGNOSES:  1. Epistaxis.  2. Anemia.  3. Coagulopathy.  ADMISSION HISTORY OF PRESENT ILLNESS:  Ms.  Mccarthy is an 77 year old white female who had been on Coumadin for five or six years secondary to DVT and pulmonary embolus.  The patient had recently had her Coumadin increased.  She awoke on the morning of admission with nose bleed.  She was unable to stop the nose bleed.  She was transported to [facility] for evaluation.  She was found to have an INR greater than 4 with persistent bleeding. Dr. Annalee Genta for ENT was called.  He initially tried nose packing which was not successful.  He subsequently had to place anterior and posterior packing of the nose.  In addition to this, her anticoagulation was reversed.  PAST MEDICAL HISTORY:  1. Hyperlipidemia.  2. Asthmatic bronchitis.  3. Allergies.  4. Diastolic dysfunction by 2-D echocardiogram.  5. Hypertension.  6. History of dyspnea on exertion.  7. Adenopathy.  8. History of DVT in the left lower extremity with multiple pulmonary emboli     by VQ scan in 1998.  9. History of congestive heart failure. 10. Status post tonsillectomy and adenoidectomy. 11. Bilateral cataract extraction with lens implants. 12. Mortons neuroma of the right foot, resected. 13. Left breast biopsy. 14. Surgical debridement of a stasis ulcer on the right leg. 15. An ______ bladder suspension.  ADMISSION LABORATORY DATA:  INR was 4.7.  Hemoglobin 13.  HOSPITAL COURSE:  #1 - EPISTAXIS IN THE SETTING OF COAGULOPATHY:  As noted, the patient required ENT to place anterior and posterior nasal packing.  On August 03, 2000, this was removed, and  she has not had any further bleeding.  If she had, she would have required surgery.  #2 - COAGULOPATHY:  As noted, the patients Coumadin had recently been increased.  The patient received 6 units of FFP and vitamin K 10 mg x 1.  This corrected her coagulopathy.  Because her DVT and PE were many years ago, her Coumadin will not be resumed.  #3 - ANEMIA:  Again, secondary to epistaxis.  The patients hemoglobin dropped from 13 and ultimately ended up 10.4.  DISCHARGE LABORATORY DATA:  Hemoglobin 10.4, hematocrit 39.7.  Pro time 12.5, INR 1.  DISCHARGE MEDICATIONS:  1. Demadex 20 mg q.d.  2. Miacalcin nasal spray q.d.  3. Zyrtec 10 mg q.d.  4. BuSpar 15 mg b.i.d.  5. Prilosec 20 mg q.d.  6. Pletal 50 mg b.i.d.  7. Antevert 25 mg as at home.  8. Cardizem CD 240 mg q.d.  9. Fosamax 70 mg weekly. 10. Advair 100/50 Diskus b.i.d. 11. Levoxyl 0.137 as at home. 12. Ditropan 5 mg b.i.d.  Otherwise, she is instructed to resume her medications as at home except she is not to take Coumadin.  FOLLOWUP:  With her primary physician who appears to be Dr. Alwyn Ren in two to three weeks. DD:  08/04/00 TD:  08/04/00 Job: [id number]

## 2010-11-13 NOTE — Procedures (Signed)
[facility]  Patient:    Stacey Mccarthy, Stacey Mccarthy Visit Number: [id number] MRN: [id number]          Service Type: END Location: ENDO Attending Physician:  Sabino Gasser Dictated by:   Sabino Gasser, M.D. Proc. Date: 03/02/01 Admit Date:  03/02/2001                             Procedure Report  PROCEDURE:  Upper endoscopy.  INDICATION FOR PROCEDURE:  Barretts symptoms.  ANESTHESIA:  Demerol 40, Versed 3.5.  DESCRIPTION OF PROCEDURE:  With the patient mildly sedated in the left lateral decubitus position, the Olympus videoscopic endoscope was inserted in the mouth and passed under direct vision through the esophagus. The distal esophagus was approached and there was a question of whether there was a stricture seen there and maybe some mild inflammatory changes after the GE junction. This was photographed. There was a hiatal hernia seen and this was photographed as well. We entered into the stomach. The fundus, body, antrum, duodenal bulb, and second portion of the duodenum all appeared relatively normal at first glance. The endoscope was pulled back in the stomach, placed in retroflexion to view the stomach from below and this showed a hiatal hernia. The endoscope was then straightened and withdrawn taking circumferential views of the entire gastric and esophageal mucosa stopping only in the stomach to biopsy the changes of gastritis that were seen in the antrum and fundus. The endoscope was then withdrawn. The patients vital signs and pulse oximeter remained stable. The patient tolerated the procedure well without apparent complications.  FINDINGS:  Hiatal hernia with question of early stricture that apparently at this time is not clinically significant. Await biopsy report. Proceed to colonoscopy as planned. The patient will call me for results and followup with me in as an outpatient. Dictated by:   Sabino Gasser, M.D. Attending Physician:  Sabino Gasser DD:   03/02/01 TD:  03/02/01 Job: [id number]

## 2010-11-13 NOTE — Discharge Summary (Signed)
NAME:  Stacey Mccarthy, Stacey Mccarthy            ACCOUNT NO.:  [id number]   MEDICAL RECORD NO.:  [id number]           PATIENT TYPE:   LOCATION:                                 FACILITY:   PHYSICIAN:  Ellie Lunch, M.D.      DATE OF BIRTH:  [birth year]   DATE OF ADMISSION:  DATE OF DISCHARGE:                               DISCHARGE SUMMARY   ADDENDUM   1. Hospital acquired pneumonia.  Note:  The patient was originally      scheduled to finish her course of Avelox in the nursing home,      however the patient continued to have diarrhea and thus her p.o.      antibiotics were discontinued and she will no longer get them at      the nursing facility.  She has already received 7 days of      antibiotics which I think should be sufficient for her pneumonia.  2. Presumed C. diff colitis.  Her stool toxins have been negative      times three, however she will finish a 7 day course of Flagyl and      needs Flagyl 500 mg p.o. t.i.d. for 2 more days instead of the 4      more days that was mentioned in the prior discharge summary.  3. COPD exacerbation.  This is remarkably better.  There is a dose      change in her prednisone from the prior discharge summary.  She      will continue to take 10 mg p.o. daily for the next 2 days,      followed by 5 mg p.o. daily for the next 2 days at the nursing      home.  4. Dysphagia.  Note:  Patient is on a dysphagia-free diet and the      speech therapist at [facility] recommended a modified barium      swallow as an outpatient which hopefully can be arranged at      [facility].  5. Atrial fibrillation.  Note:  Patient's INR was therapeutic.  She      will be discharged on a warfarin regimen as follows:  5 mg on      Sunday, Monday, Wednesday, Friday and Saturday, and 6 mg on      Tuesdays and Thursdays.  A close follow up of INR is recommended      when a patient is on Flagyl and hopefully it can be obtained at      [facility].  6: Yeast  groin infection: pt will  receive clotrimazole powder until it  resolves.   In summation, the following medications have been adjusted as compared  to the prior discharge summary:  1. Note Avelox has been discontinued.  2. Flagyl 500 mg p.o. t.i.d. needs to be continued only for 2 more      days instead of 4 days.  3. Prednisone has been changed to 10 mg p.o. daily for 2 days followed      by 5 mg p.o. daily for 2 more days.  4. Coumadin has  been changed to 5 mg on Sunday, Monday, Wednesday,      Friday, Saturday and 6 mg on Tuesday and Thursday.      Ellie Lunch, M.D.  Electronically Signed     BP/MEDQ  D:  10/21/2006  T:  10/21/2006  Job:  [id number]

## 2010-11-13 NOTE — H&P (Signed)
[facility]  Patient:    Stacey Mccarthy                    MRN: [id number] Adm. Date:  [id number] Disc. Date: [id number] Attending:  Lauree Chandler CC:         Titus Dubin. Alwyn Ren, M.D. [facility]             Madolyn Frieze. Jens Som, M.D. [facility]                         History and Physical  HISTORY OF PRESENT ILLNESS:  This 77 year old white female has had a long history of stress urinary incontinence.  She has undergone urodynamic studies that showed pressure leakage, and she also was found to have a mild cystocele. She was evaluated preoperatively by her cardiologist.  She takes Coumadin because of a history of DVT and pulmonary emboli in the past.  It was felt she could safely go off the Coumadin, but it was felt that if she had a suprapubic bladder suspension she could be continued on low-dose heparin as opposed to increased risk of anticoagulation performing a pubovaginal sling.  She is brought to the OR today for surgery.  PAST MEDICAL HISTORY:  Her other medical problems include hypothyroidism.  She has some diastolic dysfunction, hyperlipidemia, asthmatic bronchitis.  PAST SURGICAL HISTORY:  Her previous surgical procedures have included a T&A, bilateral cataract extraction and lens implantation, removal of a neuroma from the right foot, and breast biopsy.  MEDICATIONS:  Demadex, Levothroid, BuSpar, meclizine, Prilosec, Miacalcin, Cardizem, Pletal, Celebrex, Advair, Zyrtec, Coumadin which is being held, Nasonex, and vitamin B and melatonin.  ALLERGIES:  ADHESIVE TAPE, PLASTIC TAPE, DOXYCYCLINE, SEPTRA, CINOBAC, and DIFLUCAN.  SOCIAL HISTORY:  She does not smoke, and she drinks occasional wine.  FAMILY HISTORY:  Noncontributory.  REVIEW OF SYSTEMS:  As noted on her health history form.  PHYSICAL EXAMINATION:  GENERAL:  She is a pleasant white female in no acute distress, appearing younger than stated age.  She is alert and oriented, in no  respiratory distress.  VITAL SIGNS:  Blood pressure is 120/74, pulse is 92, and respiratory rate is 18, temperature is 97.2.  CARDIAC:  Heart tones are regular.  ABDOMEN:  Soft, nontender.  GENITOURINARY:  She has a cystocele on examination.  EXTREMITIES:  No edema.  IMPRESSION: 1. Stress urinary incontinence. 2. Hypothyroidism. 3. Asthmatic bronchitis. 4. Diastolic dysfunction. 5. History of deep vein thrombosis.  PLAN:  Suprapubic bladder suspension. DD:  01/25/00 TD:  01/26/00 Job: [id number]

## 2010-11-13 NOTE — H&P (Signed)
NAME:  Stacey Mccarthy, Stacey Mccarthy                      ACCOUNT NO.:  [id number]   MEDICAL RECORD NO.:  [id number]                   PATIENT TYPE:  EMS   LOCATION:  ED                                   FACILITY:  [facility]   PHYSICIAN:  Rosanne Sack, M.D.         DATE OF BIRTH:  Nov 23, 1915   DATE OF ADMISSION:  05/12/2002  DATE OF DISCHARGE:                                HISTORY & PHYSICAL   DATE OF BIRTH:  29-Dec-1915.   PROBLEM:  1. Recurrent left deep vein thrombosis.     a. History of left deep vein thrombosis, pulmonary embolism in 1993.     b. Coumadin discontinued in June of 2003.     c. History of epistaxis times two, associated with Coumadin toxicity in        January of 2003 and February 2002.  2. History of congestive heart failure, unknown data about ejection     fraction.  3. Gastroesophageal reflux disease.  4. History of chronic bronchitis.     a. Stopped smoking in 1976.  5. History of Shingles times three.  6. Hypothyroidism.  7. Status post bladder tuck for history of urinary incontinence.  8. History of stress stomach ulcer in 1993.  9. Status post sinus surgery in July of 2003.  10.      Depression.  11.      History of anemia, unknown baseline hemoglobin.  12.      Status post bilateral implants.  13.      Allergies to SULFA DRUGS, DOXYCYCLINE, AND DIFLUCAN.   CHIEF COMPLAINT:  Left lower extremity swelling.   HISTORY OF PRESENT ILLNESS:  Stacey Mccarthy is a very pleasant 77 year old  female resident of [facility], who presents with a 24  hour history of left lower extremity swelling. This morning, she woke up  with swelling in the left lower extremity. She denies chest pain. No  palpitations, cough, hemoptysis. She remembers how she felt when she had  pulmonary embolism and she denies any symptoms like the ones that she felt  in the past. The patient was on chronic anticoagulation therapy since the  early 1999's when she had a  pulmonary embolism. By the patient report, there  was not any recent surgeries or any other risk factors that could have  induced the deep vein thrombosis. Apparently, the patient has had two  episodes of epistaxis in 2002 and the beginning of 2003 that required some  blood transfusion. In July, she had some sinus surgery. At that time, Dr.  Annalee Genta and Dr. Chilton Si decided to finally stop the anticoagulation therapy.  The patient was placed on aspirin.   REVIEW OF SYSTEMS:  The patient denies rhinorrhea, post nasal drip, melena,  diarrhea stools, or bright red blood per rectum. No shortness of breath. No  chest pain. No orthopnea, focal weakness, swallowing problems, or urinary  symptoms. No fever, chills, nausea and vomiting, diarrhea or constipation.  PAST MEDICAL HISTORY:  As per problem list.   ALLERGIES:  As per problem list.   MEDICATIONS:  1. Os-Cal D one tab by mouth twice a day.  2. Celebrex 200 mg by mouth each day.  3. Glucosamine twice a day.  4. Actonel 30 mg by mouth each week.  5. Demodex 20 mg by mouth each day.  6. BuSpar 50 mg by mouth twice a day.  7. Nasonex 2 puffs to each nostril twice a day.  8. Miacalcin one puff each day.  9. Zyrtec 10 mg by mouth each day.  10.      Prozac 20 mg by mouth each day.  11.      Multivitamin one tab by mouth each day.  12.      Melatonin one tab at bedtime.  13.      Vitamin B12 complex one tab by mouth each day.  14.      Benadryl by mouth at bedtime one half tablet.  15.      Synthroid 1.127 mg by mouth each day.  16.      Advair 100/50 discus one puff twice a day.  17.      __ 5 mg by mouth each day.  18.      Antevert 25 mg by mouth twice a day as needed.  19.      Pleatal 50 mg by mouth twice a day.  20.      Nexium 40 mg by mouth twice a day.   FAMILY HISTORY:  Noncontributory given the patient's advanced age.   SOCIAL HISTORY:  The patient is a widow. She has three children. She stopped  smoking in 1976. She  drinks one glass of wine occasionally with meals.   PHYSICAL EXAMINATION:  VITAL SIGNS: Temperature 97.3, blood pressure 140/79,  heart rate 75, respiratory rate 22. O2 sat 99% on room air.  HEENT: Normocephalic, atraumatic. Pupils are equal, round, and reactive to  light and accommodation. Conjunctiva within normal limits. Extraocular  muscles intact. Funduscopic examination within normal limits. TM's within  normal limits. Moist mucous membranes. Oropharynx clear.  NECK: Supple. No jugular venous distention. No bruits. No adenopathy or  thyromegaly.  LUNGS:  Clear to auscultation and percussion without crackles. No wheezes.  Fair air movement bilaterally.  CARDIAC: Regular rate and rhythm. No murmur, rub, or gallop. Normal S1 and  S2.  ABDOMEN: Flat, nontender, nondistended. Bowel sounds were present. No  hepatosplenomegaly. No rebound, guarding, masses, or bruits.  GU: Examination within normal limits.  RECTAL: Examination not done.  EXTREMITIES: Pitting edema on the left. None on the right. No tenderness or  cords. Pulses 1+ bilaterally.  NEURO: Alert and oriented times three. Cranial nerves 2-12 are intact.  Sensory intact. Strength 5/5 in lower extremities. Deep tendon reflexes 2-  3/5 in lower extremities. Plantar reflexes downgoing bilaterally.   DIAGNOSTIC IMPRESSION:  Lower extremity venous Dopplers consistent with left  deep vein thrombosis.   ASSESSMENT/PLAN:  Recurrent left deep vein thrombosis. The patient has been  on chronic anticoagulation therapy until this past summer. The patient has  been symptoms free until yesterday after a day long trip. No lower extremity  trauma or recent surgery. My plan is to obtain blood work to rule out __.  There is no evidence of pulmonary embolism symptoms. Will start Lovenox and  Coumadin. Hopefully, will be able to start this tomorrow with a home health nurse to continue with Lovenox if the patient remains  hemodynamically stable.  Will stop aspirin. Will continue the PPI. The rest  of the patient's medical problems seem to be stable which include  gastroesophageal reflux disease and hypertension.                                                Rosanne Sack, M.D.    JM/MEDQ  D:  05/12/2002  T:  05/12/2002  Job:  [id number]

## 2010-11-13 NOTE — Op Note (Signed)
[facility]  Patient:    Stacey Mccarthy, Stacey Mccarthy Visit Number: [id number] MRN: [id number]          Service Type: DSU Location: [facility] Attending Physician:  Barbee Cough Dictated by:   Kinnie Scales. Annalee Genta, M.D. Proc. Date: 12/30/01 Admit Date:  01/19/2002                             Operative Report  PREOPERATIVE DIAGNOSIS: 1. Chronic right sphenoiditis. 2. History of severe epistaxis, status post posterior nasal packing.  POSTOPERATIVE DIAGNOSIS:  Right chronic fungal sphenoiditis.  OPERATION PERFORMED:  Right endoscopic sphenoidotomy with debridement of the right sphenoid cavity, right sphenoid sinus.  SURGEON:  Kinnie Scales. Annalee Genta, M.D.  ANESTHESIA:  General endotracheal.  COMPLICATIONS:  None.  ESTIMATED BLOOD LOSS:  Less than 50 cc.  Patient transferred from the operating room to the recovery room in stable condition.  There were no complications.  The patient will follow up in my office in one week for postoperative care.  INDICATIONS FOR PROCEDURE:  Stacey Mccarthy is an 77 year old white female who was initially evaluated in February of 2002 with severe nasal bleeding.  The patient had been on longstanding Coumadin for deep vein thrombosis and her INR was significantly elevated secondary to an excessive dose of Coumadin.  The patient had significant epistaxis requiring anterior and posterior nasal packing and cautery with normalization of her clotting factors.  Packing was removed and no further bleeding was encountered.  The patient was discharged. She presented approximately one year later in April 2003 with complaints of chronic postnasal discharge, nasal malodor and intermittent headaches.  CT scan performed by her primary care physician, Dr. Frederik Pear revealed chronic sphenoid opacification.  She was treated with multiple courses of antibiotics with failure to resolve her chronic sphenoiditis.  Given her history and findings, I  recommended that we undertake endoscopic sphenoidotomy with debridement and culture of the right sphenoid sinus.  The risks, benefits, alternatives and complications of the procedure were discussed in detail. Prior to surgery, the patients Coumadin was withheld and her PT and PTT were evaluated.  These normalized in the preoperative period.  The patient understood the risks and benefits of the procedure and concurred with our plan which was scheduled as outlined above.  DESCRIPTION OF PROCEDURE:  The patient was brought to the operating room on January 19, 2002 and placed in supine position on the operating table.  General endotracheal anesthesia was established without difficulty.  When the patient was adequately anesthetized, she was injected with approximately 7 cc of 1% lidocaine, 1:100,000 dilution epinephrine injected in submucosal fashion along the middle turbinate, superior turbinate, uncinate process, lateral nasal wall posterior nasal choana and via transoral sphenopalatine injection.  Total injection 7 cc.  The patients nose was then packed with Afrin soaked cottonoid pledgets which were left in place approximately 10 minutes to allow for vasoconstriction.  The InstaTrak head gear was then applied.  The patient was prepped and draped in sterile fashion.  Surgical and anatomic landmarks were confirmed using the InstaTrak device, and surgical procedure was begun with the removal of nasal packing.  Using a 0 degree endoscope, the patients nasal cavity was examined bilaterally.  The nasal cavity was patent.  There was no evidence of ulcer, mass or lesions.  No postnasal discharge and no evidence of mass or polyp.  No evidence of infection.  The right sphenoid ostium was obstructed with  hypertrophied mucosal tissue.  The middle turbinate was medialized, the inferior aspect of the superior turbinate was resected using straight through-cutting forceps.  This allowed direct access to  the anterior face of the sphenoid sinus/sphenoid rostrum and natural ostium above the sphenoid sinus.  Using microdebrider, polypoid material was removed. Location was confirmed using the InstaTrak with the ostium identified.  It was enlarged in a posterior medial and lateral direction staying well within the face of the sphenoid sinus.  Again anatomic locations were confirmed using the InstaTrak device.  The sphenoid sinus was entered.  There was thick solid material within the sphenoid sinus consistent with probable fungal sinusitis. This was debrided meticulously, irrigated with saline solution and removed in its entirety leaving the underlying sinus mucosa intact.  There was no evidence of infiltration or invasion of the fungal infection and the underlying mucosa appeared erythematous and somewhat swollen but no evidence of active fungal infection.  With the sinus completely cleared and the ostium widely patent, the patients nose was repacked with Afrin soaked cottonoid pledgets which were removed prior to awakening the patient.  Oral cavity and oropharynx were irrigated and suctioned and an orogastric tube was passed and the stomach contents were aspirated.  Packing was removed from the patients nose.  She was awakened from her anesthetic, extubated and transferred from the operating room to the recovery room in stable condition. Dictated by:   Kinnie Scales. Annalee Genta, M.D. Attending Physician:  Barbee Cough DD:  01/19/02 TD:  01/22/02 Job: [id number]

## 2010-11-13 NOTE — Op Note (Signed)
NAMETOMORROW, DEHAAS            ACCOUNT NO.:  [id number]   MEDICAL RECORD NO.:  [id number]          PATIENT TYPE:  AMB   LOCATION:  DAY                          FACILITY:  [facility]   PHYSICIAN:  Leonides Grills, M.D.     DATE OF BIRTH:  [birth year]   DATE OF PROCEDURE:  10/06/2004  DATE OF DISCHARGE:                                 OPERATIVE REPORT   PREOPERATIVE DIAGNOSIS:  Right fourth hammer toe.   POSTOPERATIVE DIAGNOSIS:  Right fourth hammer toe.   OPERATION:  Right fourth toe amputation.   ANESTHESIA:  General.   SURGEON:  Leonides Grills, M.D.   ASSISTANT:  Lianne Cure, P.A.   BLOOD LOSS:  Minimal.   TOURNIQUET:  None.   COMPLICATIONS:  None.   DISPOSITION:  Stable to PR.   INDICATIONS:  This is an 77 year old female who has had painful right fourth  hammer toe that has been interfering with her life __________  wants to do.  She consents to the above procedure. All risks which include infection,  neurovascular injury, drifting of her toes to fill in the vacated space were  all explained, and questions were answered.   The patient was brought to the operating room and placed in supine position.  After adequate general anesthesia was administered as well as Ancef 1 g IV  piggyback, her lower extremities were prepped and draped in a sterile  manner. A __________  incision was then made. Dissection was carried out to  bone. Proximal phalanx was skeletonized to the MTP joint, and the fourth toe  was amputated through the MTP joint. The area was copiously irrigated with  normal saline. Hemostasis was obtained. Skin was closed with 3-0 Vicryl  followed by 4-0 nylon. Sterile dressing was applied. Hard-soled shoe was  applied. The patient was stable to PR.     PB/MEDQ  D:  10/06/2004  T:  10/06/2004  Job:  [id number]

## 2010-11-13 NOTE — Op Note (Signed)
NAME:  Stacey Mccarthy, Stacey Mccarthy                      ACCOUNT NO.:  [id number]   MEDICAL RECORD NO.:  [id number]                   PATIENT TYPE:  INP   LOCATION:  5743                                 FACILITY:  [facility]   PHYSICIAN:  Sharlet Salina T. Hoxworth, M.D.          DATE OF BIRTH:  Nov 03, 1915   DATE OF PROCEDURE:  10/16/2002  DATE OF DISCHARGE:                                 OPERATIVE REPORT   PREOPERATIVE DIAGNOSIS:  Acute appendicitis.   POSTOPERATIVE DIAGNOSIS:  Acute appendicitis.   OPERATION PERFORMED:  Appendectomy.   SURGEON:  Lorne Skeens. Hoxworth, M.D.   ANESTHESIA:  General.   INDICATIONS FOR PROCEDURE:  The patient is an 77 year old white female who  has been on chronic anticoagulation for history of DVT and pulmonary  embolus.  She presents with a 24 to 36 hour history of progressive right  lower quadrant abdominal pain and has had a CT scan showing acute  appendicitis.  Following reversal of her anticoagulation with fresh frozen  plasma, she is brought to the operating room for appendectomy.  The nature  of the procedure, its indications and risks of bleeding and infection were  discussed with the patient and the family preoperatively.   DESCRIPTION OF PROCEDURE:  The patient was brought to the operating room and  placed in supine position on the operating table and general endotracheal  anesthesia was induced.  She was already on broad spectrum antibiotics.  The  abdomen was sterilely prepped and draped.  A right lower quadrant incision  was used and dissection carried down through subcutaneous tissue using  cautery.  A muscle splitting incision was used through the external oblique  and internal oblique. The transverse peritoneum were elevated and sharply  entered.  There was a small amount of cloudy fluid but no pus.  The appendix  was palpable and was exposed. and brought up into the wound with some  careful blunt dissection.  It was acutely inflamed with some early  gangrene  but no evidence of perforation or abscess.  The appendix was mobilized  slightly dividing some lateral peritoneal attachments and then the  mesoappendix was sequentially divided between clamps and tied with 3-0 silk  ties until the appendix was completely freed down to its base which was not  significantly inflamed.  The appendix was divided at its base with the TA-30  3.5 mm stapler.  The staple line was then further inverted with Lembert  sutures of 3-0 silk.  The right lower quadrant was thoroughly irrigated with  saline and inspected for hemostasis which was complete.  The viscera were  returned to their anatomic position.  The wound was closed in layers with  running 2-0 Prolene.  The subcutaneous tissue was irrigated with antibiotic  solution and the skin closed with interrupted 4-0 nylon.  Sponge, needle and  instrument counts were correct.  Dry sterile dressings were applied and the  patient taken to the recovery room  in good condition.                                               Lorne Skeens. Hoxworth, M.D.     Tory Emerald  D:  10/16/2002  T:  10/16/2002  Job:  [id number]

## 2010-11-13 NOTE — Consult Note (Signed)
NAME:  Stacey Mccarthy, Stacey Mccarthy                      ACCOUNT NO.:  [id number]   MEDICAL RECORD NO.:  [id number]                   PATIENT TYPE:  INP   LOCATION:  5743                                 FACILITY:  [facility]   PHYSICIAN:  Sharlet Salina T. Hoxworth, M.D.          DATE OF BIRTH:  04-Apr-1916   DATE OF CONSULTATION:  10/16/2002  DATE OF DISCHARGE:                                   CONSULTATION   CHIEF COMPLAINT:  Right lower quadrant abdominal pain   HISTORY OF PRESENT ILLNESS:  [name] is an 77 year old white  female resident of [facility] who presents with a twenty-four to thirty-six  hour of progressive worsening constant right lower quadrant abdominal pain.  She has had some mild nausea.  She has no previous history of similar  symptoms.  No fever or chills.  She has some chronic diarrhea that is  unchanged.   PAST MEDICAL HISTORY:  Surgical history includes bladder suspension.  Medically most significant for history of deep vein thrombosis and pulmonary  embolus about ten years ago.  She was on Coumadin for many years that was  then discontinued and six months later in November 2003, she had recurrent  deep vein thrombosis and has been on Coumadin since.  She is also followed  for osteoarthritis.   CURRENT MEDICATIONS:  Nasonex spray day, Demadex 20 mg daily, Miacalcin  spray daily, Celebrex 20 mg daily, BuSpar 15 mg twice daily, Coumadin 6 mg  daily, Antivert 25 mg twice daily, Nexium 40 mg daily, levothyroxine 0.137  mcg daily, Os-Cal twice daily.   ALLERGIES:  SULFA, VIBRAMYCIN, ASPIRIN and DIFLUCAN.   SOCIAL HISTORY:  She was accompanied by her daughter.  Resident of  [facility].  Negative cigarettes and alcohol.   REVIEW OF SYSTEMS:  Denies fever or chills.  Cardiac:  Denies chest pain,  palpitations or shortness of breath.  Respiratory:  Denies cough or  wheezing.  Abdomen:  Significant for some chronic diarrhea, also as above.  Deep Vein Thrombosis:  No  current leg swelling or pain.   PHYSICAL EXAMINATION:  VITAL SIGNS:  Temperature is 100.9, pulse 115,  respirations 20, blood pressure 100/53.  Oxygen saturation 95% on room air.  GENERAL:  She is an elderly alert white female in no acute distress.  SKIN:  Warm and dry.  HEENT:  No palpable thyromegaly or masses.  Sclerae anicteric.  LUNGS:  Clear to auscultation.  CARDIAC:  Regular rate and rhythm without murmurs.  No jugular venous  distention or peripheral edema.  ABDOMEN:  Well localized right lower quadrant tenderness with guarding.  No  palpable masses.  EXTREMITIES:  No edema, swelling or deformity.  NEUROLOGIC:  Alert and oriented.  Motor and sensory examination is grossly  normal.   LABORATORY DATA:  Prothrombin time is 24.4 with an international normalized  ratio of 2.6.  Complete metabolic panel abnormal only for glucose of 173.  Complete blood count shows a  white count of 11.7 and hemoglobin of 12.8.   CT of the abdomen shows acute appendicitis without evidence of complication.   ASSESSMENT & PLAN:  Acute appendicitis in this 77 year old woman chronically  anticoagulation for history of recurrent deep vein thrombosis and remote  history of pulmonary embolus.  She will require appendectomy.  Currently,  her anticoagulation is being reversed with fresh frozen plasma.  As soon as  we can get her prothrombin time into an acceptable range, she will be taken  to the operating room for appendectomy.  She is already on broad-spectrum  antibiotics.                                               Lorne Skeens. Hoxworth, M.D.    Tory Emerald  D:  10/16/2002  T:  10/16/2002  Job:  [id number]

## 2010-11-13 NOTE — H&P (Signed)
[facility]  Patient:    Stacey Mccarthy, Stacey Mccarthy                   MRN: [id number] Adm. Date:  [id number] Attending:  Sandi Raveling                         History and Physical  CHIEF COMPLAINT:  Epistaxis.  HISTORY OF PRESENT ILLNESS:  Stacey Mccarthy is a very pleasant 77 year old woman on Coumadin because of a history of five to six years ago of a deep vein thrombosis with multiple small pulmonary emboli.  The patient recently had her Coumadin increased.  The patient awoke this morning at 4 a.m. with a significant nose bleed.  She was unable to get the bleeding stopped, and was transported to [facility] Emergency Room for evaluation. At that time she was found to have an INR of greater than 4, with persistent bleeding.  Dr. Kinnie Scales. [facility] for ENT was called.  He initially tried nose packing, which did not succeed in stopping the flow.  She subsequently was admitted for a reversal of her coagulopathy, and for an anterior and posterior packing of the nose.  PAST SURGICAL HISTORY:  1. Tonsillectomy and adenoidectomy.  2. Bilateral cataract extraction with lens implants.  3. A Mortons neuroma from her right foot.  4. Left breast biopsy.  5. Surgical debridement of a stasis ulcer of her right leg.  6. An A&P bladder suspension recently.  PAST MEDICAL HISTORY:  1. History of hyperlipidemia.  2. History of asthmatic bronchitis.  3. History of allergy.  4. History of diastolic dysfunction by an echocardiogram.  5. History of hypertension with dyspnea on exertion.  6. History of adenopathy.  7. History of deep vein thrombosis in the left lower extremity,     with multiple pulmonary emboli, by a V/Q scan, with follow-up     extremity ultrasound in 1998, which showed no valvular incompetence.  8. History of congestive heart failure.  CURRENT MEDICATIONS:  1. Nasonex q.d.  2. Demadex 20 mg q.d.  3. Miacalcin nasal spray q.d.  4. Zyrtec 10 mg q.d.  5. Celebrex 200 mg q.d.  6. BuSpar 15 mg b.i.d.  7. Prilosec 20 mg q.d.  8. Coumadin as instructed.  9. Pletal 50 mg b.i.d. 10. Antivert 25 mg, two tablets q.d. 11. Allergy shots weekly. 12. Benadryl q.h.s. 13. Aspercreme to her knee. 14. Melatonin at bedtimes. 15. Levoxyl 0.137 mg, 1/2 tablet on Monday, Wednesday, and Friday.     A full tablet on Tuesday, Thursday, Saturday, and Sunday. 16. Advair 100/50, one inhalation q.a.m., and q.h.s. 17. Vitamin D 400 IU q.d. 18. Calcium 1200 mg b.i.d. 19. Fosamax 70 mg weekly. 20. Cardizem CD 140 mg q.d.  FAMILY HISTORY:  Noncontributory.  SOCIAL HISTORY:  The patient is a widow.  She has three children.  She currently lives at [facility] independently.  She has a history of tobacco abuse for 40 years, but quit smoking in 1976.  ALLERGIES:  DOXYCYCLINE, SULFA, AND DIFLUCAN.  PHYSICAL EXAMINATION:  VITAL SIGNS:  On admission temperature 97.1 degrees, blood pressure 145/73, pulse 112, respirations 22.  GENERAL:  A well-developed, well-nourished woman, who looks her stated age, in no acute distress.  HEENT:  Normocephalic, atraumatic.  The patient has a packing gauze to her nose, with active bleeding noted.  Oropharynx and nasal examination deferred to Dr. Annalee Genta.  Conjunctivae clear.  NECK:  Supple without thyromegaly.  NODES:  No adenopathy noted in the cervical or supraclavicular regions.  CHEST:  No CVA tenderness.  LUNGS:  Clear to auscultation and percussion.  CARDIOVASCULAR:  With 2+ radial pulses.  No jugular venous distention, no carotid bruits.  She had a quiet precordium with regular rate and rhythm, without murmurs, rubs, or gallops.  ABDOMEN:  Soft, no guarding or rebound noted.  PELVIC:  Deferred.  RECTAL:  Deferred.  EXTREMITIES:  Bruise on her left knee.  LABORATORY DATA:  The patients INR was 4.7.  Initial hemoglobin 13.  Four hours later the hemoglobin had dropped to  11.6.  ASSESSMENT/PLAN: 1. Epistaxis.  A patient with marked epistaxis of 1.4 g of hemoglobin    in four hours.  The patient is anticoagulated for a history of    pulmonary embolus.  PLAN:  The patient needs a reversal of her coagulopathy.  Will give her 10 mcg of vitamin K IV, and will give her 6 units of fresh frozen plasma. Dr. Annalee Genta has placed an anterior and posterior packing.  2. Cardiovascular:  PLAN:  The patient will continue on her home medications as possible.  3. Pulmonary:  PLAN:  The patients medications will be held in abeyance at this time, and watch carefully for her symptoms.  IMPRESSION:  In summary this is a pleasant 77 year old woman with epistaxis, in need of inpatient treatment with anticoagulation reversal and ENT intervention. DD:  08/01/00 TD:  08/01/00 Job: [id number]

## 2010-11-13 NOTE — H&P (Signed)
NAME:  Stacey Mccarthy, FREEBERG            ACCOUNT NO.:  [id number]   MEDICAL RECORD NO.:  [id number]          PATIENT TYPE:  INP   LOCATION:  1829                         FACILITY:  [facility]   PHYSICIAN:  Mobolaji B. Bakare, M.D.DATE OF BIRTH:  [birth year]   DATE OF ADMISSION:  10/13/2006  DATE OF DISCHARGE:                              HISTORY & PHYSICAL   PRIMARY CARE PHYSICIAN:  Lenon Curt. Chilton Si, M.D.   CHIEF COMPLAINT:  Hypoxia.   HISTORY OF PRESENTING COMPLAINT:  Stacey Mccarthy is an 77 year old  resident of [facility] facility.  She went for L5 kyphoplasty today.  Post procedure, the patient desaturated to 87% oxygen on room air.  She  was not particularly short of breath.  The patient was sent to the  emergency room for that evaluation.  Additionally, she had a low-grade  temperature of 100.5.   The patient was at [facility] facility for rehabilitation.  She has  compression fracture with some degree of back pain.  She was recently  started on fentanyl  patch on October 07, 2006.  She was seen by Dr.  Murray Hodgkins on April 15 and according to reports, she was confused as  per nurse's report.  It was felt that the patient was having side  effects to narcotic medication which she increasingly required for the  back pain.  Adjustment was made to her pain medication.   Her son stated that the patient has been coughing lately and she does  have pleuritic chest pain on coughing, but there has not been chills.  She had a CT scan of the chest which showed to rule out pulmonary  embolism and this was negative for PE.  However, it showed suspicious  area in the right upper lobe and right lower lobe for pneumonia.  She  had a D-dimer which is mildly elevated at 108.  She has received IV  antibiotics in the emergency room.  She has been admitted for further  treatment.   REVIEW OF SYSTEMS:  She denies diarrhea, abdominal pain, nausea,  vomiting, dysuria or foul-smelling urine.  No change in  vision.  No  focal weakness.  There is no chest pain, no precordial chest pain, no  shortness of breath that the patient is aware of.   PAST MEDICAL HISTORY:  1. Hypertension.  2. CHF.  3. Gastroesophageal reflux disease.  4. Remote peptic ulcer disease.  5. Depression.  6. COPD.  7. DVT and pulmonary embolism in 1999.   PAST SURGICAL HISTORY:  1. Appendectomy.  2. Bladder tack.  3. Bilateral lens implants.   CURRENT MEDICATIONS:  1. __________ 10 mg daily.  2. Glucosamine chondroitin three tablets a day.  3. Ditropan 5 mg twice a day.  4. Palgic 4 mg q.12h.  5. BuSpar 5 mg twice a day.  6. Actonel 30 mg weekly.  7. Fluoxetine 20 mg daily.  8. Flexeril 5 mg q.8h.  9. Levoxyl 125 mcg daily.  10.Vitamin D 400 units twice a day.  11.Tylenol two tablets q.6h. p.r.n.  12.Advair Diskus 100/50 two puffs daily p.r.n.  13.Nexium 40 mg once a day.  14.Luvox 25 mg daily.  15.Nasonex 15 mcg two sprays each nostril daily.  16.Os-Cal 500/200 units one tablet twice a day.  17.Vicodin 5/500 once q.6h. p.r.n.  18.Coumadin. Is on hold.  19.She was on Arixtra prior to procedure which was discontinued on the      morning of October 12, 2006.   ALLERGIES:  1. SULFA.  2. DOXYCYCLINE.  3. DIFLUCAN.  4. CINOBAC.  5. VIBRAMYCIN.  6. ASPIRIN.  7. STRAWBERRIES.  8. BANANAS.   FAMILY HISTORY:  Noncontributory.   SOCIAL HISTORY:  The patient is in a rehab facility, [facility].  She  currently does not smoke cigarettes nor drink alcohol.   PHYSICAL EXAMINATION:  VITAL SIGNS:  Initial temperature 100.2, BP  109/48, pulse 97, respiratory rate 18, oxygen saturation 97% on oxygen.  GENERAL:  The patient is awake, alert and arousable but feels drowsy.  Not pale.  Not in respiratory distress.  HEENT:  Anicteric.  Normocephalic.  NECK:  No elevated JVD.  No carotid bruits.  LUNGS:  Bilateral expiratory rhonchi.  Diminished anterior lung bases.  CARDIOVASCULAR:  S1 and S2.  Regular.  ABDOMEN:   Not distended.  Soft, nontender.  Bowel sounds present.  EXTREMITIES:  No pedal edema or calf tenderness.  Left calf slightly  swollen on the right.  She has a soft bruise over her right knee.  CNS:  No focal neurological deficits.   LABORATORY DATA:  WBC 11.5, hemoglobin 13.6, hematocrit 38.4, MCV 94.7,  platelets 252, neutrophils 81%.  BNP 108.  Sodium 130, potassium 4.1,  chloride 99, bicarb 22, glucose 125, BUN 12, creatinine 1.1, calcium  8.7.  D-dimer 2.95.  PT/INR 13.8/1.  Chest x-ray: Bilateral air space  disease, right worse than left which could represent multifocal  pneumonia or pulmonary edema.  CT scan angiogram of the chest reported  as noted in the HPI.   ASSESSMENT/PLAN:  Stacey Mccarthy is an 77 year old Caucasian female who  underwent kyphoplasty today and was noted to desaturate post procedure.  She  had a chest x-ray and CT scan which is highly suggestive of  multifocal pneumonia.  She will be admitted for healthcare associated  pneumonia.   ADMISSION DIAGNOSES:  1. Healthcare associated pneumonia.  The patient will be started on      Zosyn and vancomycin.  Blood cultures have been drawn.  Sputum      culture is possible.  Will be nebulized on Atrovent and albuterol.      Oxygen to keep saturation greater than 90%.  She should have a      swallowing evaluation to rule out aspiration.  2. Confusion, likely secondary to pneumonia and narcotic medication.      Will avoid sedating medications.  I believe she would not need as      much narcotics at this point whereby she has had kyphoplasty done.      Would discontinue fentanyl  patch altogether.  3. L5 compression fracture, status post kyphoplasty.  4. History of pulmonary embolism.  There was no pulmonary embolism on      CT scan.  She would resume Coumadin and will bridge with Lovenox 1      mg/kg/day body weight,  pharmacy to dose. 5. History of congestive heart failure.  The patient is clinically not      on  diuretics.  She appears compensated.  We will use IV fluids      judicially.  6. Code status.  The patient is full code  as per son.      Mobolaji B. Corky Downs, M.D.     MBB/MEDQ  D:  10/14/2006  T:  10/14/2006  Job:  [id number]   cc:   Lenon Curt. Chilton Si, M.D.

## 2010-11-13 NOTE — Discharge Summary (Signed)
[facility]  Patient:    Stacey Mccarthy, Stacey Mccarthy                   MRN: [id number] Adm. Date:  [id number] Disc. Date: [id number] Attending:  Lauree Chandler                           Discharge Summary  FINAL DIAGNOSES: 1. Stress urinary incontinence. 2. Hypothyroidism. 3. Hyperlipidemia. 4. Asthma. 5. Esophageal reflux. 6. Diastolic dysfunction.  PROCEDURES:  Suprapubic bladder suspension on January 25, 2000.  HISTORY OF PRESENT ILLNESS:  This 77 year old white female has had a long history of stress incontinence.  She underwent urodynamic study that showed definite urinary leakage.  She had a mild cystocele.  She had to be taken off Coumadin which was approved by her cardiologist.  She had been on that for a past history of DVT and pulmonary emboli.  Physical examination revealed an anxious white female in no acute distress with a mild cystocele on examination.  HOSPITAL COURSE:  After admission, she underwent suprapubic bladder suspension.  During her postoperative course, she was noted to have a slight fever which cleared nicely with ambulation and pulmonary toilet.  When her catheter came out, she had a lot of bladder spasms and increased PVR, so she had to be placed on Flomax to allow satisfactory emptying of her bladder.  She also had some constipation problems requiring laxatives.  Her residual urine was improving, and, although she had some urgency, she was ready for discharge on January 30, 2000.  DISCHARGE MEDICATIONS:  She would go home on her usual medications of Demadex, Levothroid, BuSpar, Meclizine, Prilosec, Miacalcin, Cardizem, Plendil, Celebrex, Zyrtec, Nasonex, and she has been started back on her Coumadin.  ACTIVITY:  Limited.  DIET:  As tolerated.  FOLLOWUP:  She will return to the office in one week for follow up.  DISCHARGE CONDITION:  She was sent home in stable condition. DD:  02/12/00 TD:  02/15/00 Job:  [id number]

## 2010-11-13 NOTE — Discharge Summary (Signed)
NAME:  Stacey Mccarthy, Stacey Mccarthy                      ACCOUNT NO.:  [id number]   MEDICAL RECORD NO.:  [id number]                   PATIENT TYPE:  INP   LOCATION:  5743                                 FACILITY:  [facility]   PHYSICIAN:  Lonia Blood, M.D.            DATE OF BIRTH:  January 28, 1916   DATE OF ADMISSION:  10/15/2002  DATE OF DISCHARGE:  10/25/2002                                 DISCHARGE SUMMARY   DISCHARGE DIAGNOSES:  1. Acute appendicitis, status post open appendectomy.  2. Wound infection with right flank cellulitis, resolving.  3. Urinary incontinence, Klebsiella urinary tract infection, responding to     antibiotic therapy.  4. History of deep venous thrombosis and pulmonary embolism, Lovenox and     Coumadin combined therapy until international normalized ratio     therapeutic.  5. Normocytic anemia secondary to surgical loss with hemoglobin 11.2.  6. Osteoarthritis.  7. Hypertension.   DISCHARGE MEDICATIONS:  1. Demodex 20 mg p.o. q.d. on hold.  2. Prozac 20 mg p.o. q.d.  3. Protonix 40 mg p.o. q.d.  4. Synthroid 137 mcg p.o. q.d.  5. Lovenox 80 mg subcu q.12h. until 48 hours of overlap with therapeutic     Coumadin.  6. Ditropan XL 10 mg p.o. q.d.  7. Augmentin 500 mg p.o. b.i.d. x7 days then discontinue.  8. Coumadin 7.5 mg p.o. q.d.  9. Tylenol 650 mg p.o. q.4h. p.r.n.  10.      Phenergan 12.5-25 mg p.o. q.4-6h. p.r.n.   WOUND CARE:  Normal saline, wet-to-dry wound packing to surgical wound in  right lower quadrant, change q.d.   FOLLOW UP:  The patient is to call Dr. Jamse Mead office for follow-up  appointment in 7-10 days at [phone].  Medical followup will be accomplished  by Dr. [facility] Hodgkins at [facility].  Medical followup should focus on  assessment of INR on a daily basis until such time that INR is consistently  at approximately 2.5.  Lovenox can be discontinued at such time that 48  hours of overlap is accomplished.   CONSULTATIONS:  Sharlet Salina  T. Hoxworth, M.D., general surgery.   PROCEDURES:  1. Computed tomography scan of the abdomen on the date of admission     consistent with acute appendicitis.  2. Open appendectomy on October 16, 2002, by Dr. Glenna Fellows.   HISTORY OF PRESENT ILLNESS:  The patient is a pleasant, 77 year old resident  of [facility] who awoke on the morning of admission with right lower  quadrant abdominal pain.  Pain progressed through the day and as a result,  she sought medical attention.  Medical evaluation was consistent with severe  abdominal discomfort and the question of appendicitis was raised.  The  patient was admitted to the hospital for evaluation.   HOSPITAL COURSE:  Problem 1.  ACUTE APPENDICITIS:  At the time of admission,  the primary working diagnosis was the possibility of acute appendicitis.  CT  scan of the abdomen and pelvis was obtained and was consistent with acute  appendicitis.  As a result, a general surgery consultation was accomplished.  The patient had to receive FFP to reverse her Coumadin in preparation for  her to have surgery.  Dr. Glenna Fellows saw the patient in consultation.  After the patient's INR was normalized, the patient was taken to the  operating room and an open appendectomy was accomplished.  Postop course was  complicated by a wound infection with surrounding cellulitis.  The patient  was kept on IV Unasyn which was then titrated to p.o. Augmentin and she  continued antibiotic treatment throughout the course of hospitalization.  The wound infection was treated with normal saline gauze and at first  required packing with three 4 x 4 gauzes.  It responded well to this therapy  and was improving significantly at the time of discharge.  At the time of  discharge, the patient's wound only required one 4 x 4 for packing.  Surrounding cellulitis had resolved.  There were no further complications  related to the patient's intraabdominal infection after her  appendectomy.  At the time of discharge, she is tolerating p.o. intake and regular diet.  The wound is healing nicely.  She will continue normal saline wet-to-dry  packing of her wound q.d. with wound to be dressed q.d.  She will continue  Augmentin for seven additional days for treatment of her wound infection.  She will follow up with Dr. Johna Sheriff as noted above.   Problem 2.  KLEBSIELLA URINARY TRACT INFECTION:  The patient has a long-  standing history of urinary incontinence and is on medical therapy for this.  However, symptoms were worse during her hospitalization.  This prompted  ordering of a urinalysis which did reveal a significant urinary tract  infection.  Culture revealed Klebsiella as the offending agent.  The patient  was treated with Unasyn and then Augmentin which should appropriately  address the situation.  At the time of discharge, there was no dysuria and  the patient's urinary incontinence had to return to its baseline status.   Problem 3.  HISTORY OF PULMONARY EMBOLISM AND DEEP VENOUS THROMBOSIS:  The  patient has a distant history of pulmonary embolism secondary to DVT.  As a  result, she is on chronic Coumadin therapy.  At initial presentation, her  Coumadin had to be reversed with transfusion of FFP to prepare her for  surgery.  Status post surgery, she was placed on Lovenox and Coumadin  therapy.  At the time of discharge, she is not yet therapeutic on her  Coumadin and therefore, Lovenox should be continued on the schedule noted  above.  INR is 1.8 on date of discharge, April 29.  It is recommended that  INR be checked on a q.d. basis until such time it is greater than 2.0 x48  hours at which time Lovenox could be discontinued and Coumadin will be  simply continued on a daily basis.   Problem 4.  NORMOCYTIC ANEMIA:  At the time of admission, the patient was  not anemic with a hemoglobin at approximately 13.  After surgery, there was not a lot of blood loss  and hemoglobin settled around 11.0.  This was a  normocytic anemia.  Hemoglobin remained stable from the 10.5-11.5 range  throughout the remainder of hospitalization.  In that this anemia is felt to  be secondary to surgical loss, no further evaluation was felt to be  necessary at this time.  CBC should be followed up in approximately one  month to ensure that the patient's anemia has resolved.                                               Lonia Blood, M.D.    JTM/MEDQ  D:  10/25/2002  T:  10/25/2002  Job:  [id number]

## 2010-11-13 NOTE — Discharge Summary (Signed)
Stacey Mccarthy, Stacey Mccarthy            ACCOUNT NO.:  [id number]   MEDICAL RECORD NO.:  [id number]          PATIENT TYPE:  INP   LOCATION:  5503                         FACILITY:  [facility]   PHYSICIAN:  Ellie Lunch, M.D.      DATE OF BIRTH:  Aug 29, 1915   DATE OF ADMISSION:  10/13/2006  DATE OF DISCHARGE:                               DISCHARGE SUMMARY   PRIMARY CARE PHYSICIAN:  Dr. Lenon Curt. [place]   DISCHARGE DIAGNOSES:  1. Hospital-acquired pneumonia.  2. Diarrhea, most likely secondary to Clostridium difficile colitis;      however, Clostridium difficile stool toxin was negative x2.  3. Chronic obstructive pulmonary disease exacerbation, most likely      secondary to the hospital-acquired pneumonia.   PAST MEDICAL HISTORY:  1. Hypertension.  2. CHF.  3. GERD.  4. Remote peptic ulcer disease.  5. Depression.  6. Allergic rhinitis.  7. COPD.  8. DVT and pulmonary embolism in 1999.  9. Hypothyroidism.   DISCHARGE MEDICATIONS:  1. Coumadin 7.5 mg p.o. daily.  2. Glucosamine/chondroitin three tablets a day.  3. Ditropan 5 mg b.i.d.  4. Palgic 4 mg p.o. q.12h.  5. BuSpar 5 mg p.o. b.i.d.  6. Actonel 30 mg weekly.  7. Fluoxetine 20 mg daily.  8. Flexeril 5 mg p.o. q.8h. p.r.n. pain/muscle spasm.  9. Levoxyl 125 mcg p.o. daily.  10.Vitamin B 400 units twice a day.  11.Tylenol 325 mg one to two tablets p.o. q.6h. p.r.n. pain.  12.Advair Diskus 100/50 inhalation twice a day.  13.Albuterol metered dose inhaler one to two puffs every 6 hours as      needed for shortness of breath/wheezing.  14.Nexium 40 mg p.o. daily.  15.Luvox 25 mg p.o. daily.  16.Nasonex 15 mcg two sprays each nostril daily.  17.Oscal 500 mg p.o. b.i.d.  18.Vicodin 5/500 mg p.o. q.6h. p.r.n. pain.  19.Avelox 400 mg p.o. daily for 3 more days for a total of 10 days of      antibiotics.  20.Prednisone 20 mg starting April 24 for three days followed by 10 mg      for remaining three days.  21.Flagyl 500 mg p.o.  t.i.d. for four more days for a total of 7 days.  22.Imodium 2 mg p.o. q.6h. p.r.n. diarrhea, dose not to exceed more      than 8 mg per day.   DISPOSITION AND FOLLOWUP:  The patient is currently being discharged to  the [facility] skilled nursing facility.  Please note the patient will  need a followup PT/INR since her Coumadin is being adjusted during this  hospital course for PE and DVT.   ADMISSION HISTORY AND PHYSICAL:  Ms. Whittinghill is a pleasant 77 year old  resident of [facility] nursing facility that underwent L5 kyphoplasty  on October 13, 2006.  However, post procedure the patient desaturated to  87% oxygen on room air but was not particularly short of breath.  Her CT  scan was negative for pulmonary embolism but did show right upper lobe  and right lower lobe pneumonia and she was admitted for further  evaluation.   HOSPITAL COURSE:  #  1 - HOSPITAL-ACQUIRED PNEUMONIA.  The patient had  findings suggestive of right upper lobe and right middle lobe pneumonia  on CT scan performed on April 17.  She was placed on IV antibiotics -  namely, vancomycin and Zosyn.  These were then switched to Avelox on  April 23 which the patient tolerated well.  She will continue to get  Avelox for 3 more days for a total of 10 days of antibiotics.  Note  blood cultures were negative.   #2 - DIARRHEA, MOST LIKELY ANTIBIOTIC ASSOCIATED.  Note the patient  started having diarrhea during hospital course.  C. difficile stool  toxin was sent which was negative x2.  However, given the fact that it  started after the patient was on antibiotics, she is being treated with  Flagyl which she will continue to get at the skilled nursing facility.   #3 - HISTORY OF PULMONARY EMBOLISM.  Note the patient's Coumadin was  initially on hold because of the surgery.  However, the patient was  started on Lovenox and Coumadin and her INR on the day prior to  discharge was 1.9.  If it continues to be elevated she will  be  discharged on Lovenox as well as Coumadin until her INR becomes  therapeutic.   For further details please refer to the hospital chart.  All of the  other medical conditions remained stable on home medications.      Ellie Lunch, M.D.  Electronically Signed     BP/MEDQ  D:  10/19/2006  T:  10/19/2006  Job:  [id number]   cc:   Lenon Curt. Chilton Si, M.D.

## 2011-03-24 DIAGNOSIS — D689 Coagulation defect, unspecified: Secondary | ICD-10-CM

## 2011-03-24 HISTORY — DX: Coagulation defect, unspecified: D68.9

## 2011-04-12 LAB — BLOOD GAS, ARTERIAL
Acid-base deficit: 3.1 — ABNORMAL HIGH
pCO2 arterial: 30.7 — ABNORMAL LOW

## 2011-05-08 ENCOUNTER — Other Ambulatory Visit: Payer: Self-pay

## 2011-05-08 ENCOUNTER — Emergency Department (HOSPITAL_COMMUNITY): Payer: Medicare Other

## 2011-05-08 ENCOUNTER — Inpatient Hospital Stay (HOSPITAL_COMMUNITY)
Admission: EM | Admit: 2011-05-08 | Discharge: 2011-05-13 | DRG: 470 | Disposition: A | Discharge status: Skilled Nursing Facility | Payer: Medicare Other | Attending: Internal Medicine | Admitting: Internal Medicine

## 2011-05-08 DIAGNOSIS — F3289 Other specified depressive episodes: Secondary | ICD-10-CM | POA: Diagnosis present

## 2011-05-08 DIAGNOSIS — G20A1 Parkinson's disease without dyskinesia, without mention of fluctuations: Secondary | ICD-10-CM

## 2011-05-08 DIAGNOSIS — B961 Klebsiella pneumoniae [K. pneumoniae] as the cause of diseases classified elsewhere: Secondary | ICD-10-CM | POA: Diagnosis present

## 2011-05-08 DIAGNOSIS — G20 Parkinson's disease: Secondary | ICD-10-CM

## 2011-05-08 DIAGNOSIS — S72001A Fracture of unspecified part of neck of right femur, initial encounter for closed fracture: Secondary | ICD-10-CM | POA: Diagnosis present

## 2011-05-08 DIAGNOSIS — E039 Hypothyroidism, unspecified: Secondary | ICD-10-CM

## 2011-05-08 DIAGNOSIS — W19XXXA Unspecified fall, initial encounter: Secondary | ICD-10-CM

## 2011-05-08 DIAGNOSIS — Z79899 Other long term (current) drug therapy: Secondary | ICD-10-CM

## 2011-05-08 DIAGNOSIS — F329 Major depressive disorder, single episode, unspecified: Secondary | ICD-10-CM | POA: Diagnosis present

## 2011-05-08 DIAGNOSIS — Y9210 Unspecified residential institution as the place of occurrence of the external cause: Secondary | ICD-10-CM | POA: Diagnosis present

## 2011-05-08 DIAGNOSIS — N390 Urinary tract infection, site not specified: Secondary | ICD-10-CM | POA: Clinically undetermined

## 2011-05-08 DIAGNOSIS — I2782 Chronic pulmonary embolism: Secondary | ICD-10-CM | POA: Diagnosis present

## 2011-05-08 DIAGNOSIS — J309 Allergic rhinitis, unspecified: Secondary | ICD-10-CM | POA: Diagnosis present

## 2011-05-08 DIAGNOSIS — I10 Essential (primary) hypertension: Secondary | ICD-10-CM | POA: Diagnosis present

## 2011-05-08 DIAGNOSIS — Y998 Other external cause status: Secondary | ICD-10-CM

## 2011-05-08 DIAGNOSIS — Y93E8 Activity, other personal hygiene: Secondary | ICD-10-CM

## 2011-05-08 DIAGNOSIS — J4489 Other specified chronic obstructive pulmonary disease: Secondary | ICD-10-CM | POA: Diagnosis present

## 2011-05-08 DIAGNOSIS — Z66 Do not resuscitate: Secondary | ICD-10-CM | POA: Diagnosis present

## 2011-05-08 DIAGNOSIS — I2699 Other pulmonary embolism without acute cor pulmonale: Secondary | ICD-10-CM

## 2011-05-08 DIAGNOSIS — S81009A Unspecified open wound, unspecified knee, initial encounter: Secondary | ICD-10-CM | POA: Diagnosis present

## 2011-05-08 DIAGNOSIS — S72009A Fracture of unspecified part of neck of unspecified femur, initial encounter for closed fracture: Principal | ICD-10-CM | POA: Diagnosis present

## 2011-05-08 DIAGNOSIS — M810 Age-related osteoporosis without current pathological fracture: Secondary | ICD-10-CM | POA: Diagnosis present

## 2011-05-08 DIAGNOSIS — R5381 Other malaise: Secondary | ICD-10-CM | POA: Diagnosis present

## 2011-05-08 DIAGNOSIS — E785 Hyperlipidemia, unspecified: Secondary | ICD-10-CM | POA: Diagnosis present

## 2011-05-08 DIAGNOSIS — IMO0002 Reserved for concepts with insufficient information to code with codable children: Secondary | ICD-10-CM

## 2011-05-08 DIAGNOSIS — K219 Gastro-esophageal reflux disease without esophagitis: Secondary | ICD-10-CM | POA: Diagnosis present

## 2011-05-08 DIAGNOSIS — J449 Chronic obstructive pulmonary disease, unspecified: Secondary | ICD-10-CM

## 2011-05-08 DIAGNOSIS — I4891 Unspecified atrial fibrillation: Secondary | ICD-10-CM | POA: Diagnosis present

## 2011-05-08 DIAGNOSIS — R1310 Dysphagia, unspecified: Secondary | ICD-10-CM

## 2011-05-08 DIAGNOSIS — Z7901 Long term (current) use of anticoagulants: Secondary | ICD-10-CM

## 2011-05-08 HISTORY — DX: Low vision, one eye, unspecified eye: H54.50

## 2011-05-08 HISTORY — DX: Fracture of one rib, unspecified side, initial encounter for closed fracture: S22.39XA

## 2011-05-08 HISTORY — DX: Chronic obstructive pulmonary disease, unspecified: J44.9

## 2011-05-08 HISTORY — DX: Allergic rhinitis due to pollen: J30.1

## 2011-05-08 HISTORY — DX: Parkinson's disease: G20

## 2011-05-08 HISTORY — DX: Other fatigue: R53.83

## 2011-05-08 HISTORY — DX: Hypoxemia: R09.02

## 2011-05-08 HISTORY — DX: Dysphagia, oropharyngeal phase: R13.12

## 2011-05-08 HISTORY — DX: Phlebitis and thrombophlebitis of other deep vessels of unspecified lower extremity: I80.299

## 2011-05-08 HISTORY — DX: Insomnia, unspecified: G47.00

## 2011-05-08 HISTORY — DX: Polyneuropathy, unspecified: G62.9

## 2011-05-08 HISTORY — DX: Parkinson's disease without dyskinesia, without mention of fluctuations: G20.A1

## 2011-05-08 HISTORY — DX: Air embolism (traumatic), initial encounter: T79.0XXA

## 2011-05-08 HISTORY — DX: Long term (current) use of anticoagulants: Z79.01

## 2011-05-08 HISTORY — DX: Shortness of breath: R06.02

## 2011-05-08 HISTORY — DX: Unspecified osteoarthritis, unspecified site: M19.90

## 2011-05-08 HISTORY — DX: Unspecified abnormalities of gait and mobility: R26.9

## 2011-05-08 HISTORY — DX: Unspecified kyphosis, site unspecified: M40.209

## 2011-05-08 HISTORY — DX: Reserved for inherently not codable concepts without codable children: IMO0001

## 2011-05-08 HISTORY — DX: Age-related osteoporosis without current pathological fracture: M81.0

## 2011-05-08 HISTORY — DX: Depression, unspecified: F32.A

## 2011-05-08 HISTORY — DX: Pneumonia, unspecified organism: J18.9

## 2011-05-08 HISTORY — DX: Other malaise: R53.81

## 2011-05-08 HISTORY — DX: Muscle weakness (generalized): M62.81

## 2011-05-08 HISTORY — DX: Encounter for other specified aftercare: Z51.89

## 2011-05-08 HISTORY — DX: Rash and other nonspecific skin eruption: R21

## 2011-05-08 HISTORY — DX: Essential (primary) hypertension: I10

## 2011-05-08 HISTORY — DX: Heart failure, unspecified: I50.9

## 2011-05-08 HISTORY — DX: Other pulmonary embolism without acute cor pulmonale: I26.99

## 2011-05-08 HISTORY — DX: Hypothyroidism, unspecified: E03.9

## 2011-05-08 HISTORY — DX: Edema, unspecified: R60.9

## 2011-05-08 HISTORY — DX: Multiple fractures of ribs, unspecified side, initial encounter for closed fracture: S22.49XA

## 2011-05-08 HISTORY — DX: Pain in unspecified knee: M25.569

## 2011-05-08 HISTORY — DX: Abnormal electrocardiogram (ECG) (EKG): R94.31

## 2011-05-08 HISTORY — DX: Sacroiliitis, not elsewhere classified: M46.1

## 2011-05-08 HISTORY — DX: Coagulation defect, unspecified: D68.9

## 2011-05-08 HISTORY — DX: Hyperlipidemia, unspecified: E78.5

## 2011-05-08 HISTORY — DX: Unspecified atrial fibrillation: I48.91

## 2011-05-08 HISTORY — DX: Unspecified fracture of unspecified thoracic vertebra, initial encounter for closed fracture: S22.009A

## 2011-05-08 HISTORY — DX: Gastro-esophageal reflux disease with esophagitis: K21.0

## 2011-05-08 HISTORY — DX: Major depressive disorder, single episode, unspecified: F32.9

## 2011-05-08 HISTORY — DX: Pathological fracture, other site, initial encounter for fracture: M84.48XA

## 2011-05-08 HISTORY — DX: Adverse effect of unspecified anesthetic, initial encounter: T41.45XA

## 2011-05-08 HISTORY — DX: Gastro-esophageal reflux disease with esophagitis, without bleeding: K21.00

## 2011-05-08 HISTORY — DX: Unspecified urinary incontinence: R32

## 2011-05-08 HISTORY — DX: Constipation, unspecified: K59.00

## 2011-05-08 HISTORY — DX: Disorder of thyroid, unspecified: E07.9

## 2011-05-08 HISTORY — DX: Spontaneous ecchymoses: R23.3

## 2011-05-08 HISTORY — DX: Other complications of anesthesia, initial encounter: T88.59XA

## 2011-05-08 HISTORY — DX: Unilateral inguinal hernia, without obstruction or gangrene, not specified as recurrent: K40.90

## 2011-05-08 HISTORY — DX: Gastro-esophageal reflux disease without esophagitis: K21.9

## 2011-05-08 LAB — URINE MICROSCOPIC-ADD ON

## 2011-05-08 LAB — URINALYSIS, ROUTINE W REFLEX MICROSCOPIC
Glucose, UA: NEGATIVE mg/dL
Ketones, ur: NEGATIVE mg/dL
Nitrite: NEGATIVE
pH: 5.5 (ref 5.0–8.0)

## 2011-05-08 LAB — CBC
HCT: 40.5 % (ref 36.0–46.0)
Hemoglobin: 12.7 g/dL (ref 12.0–15.0)
MCV: 84.6 fL (ref 78.0–100.0)
RDW: 25.7 % — ABNORMAL HIGH (ref 11.5–15.5)
WBC: 18 10*3/uL — ABNORMAL HIGH (ref 4.0–10.5)

## 2011-05-08 LAB — APTT: aPTT: 28 seconds (ref 24–37)

## 2011-05-08 LAB — BASIC METABOLIC PANEL
BUN: 22 mg/dL (ref 6–23)
CO2: 26 mEq/L (ref 19–32)
Chloride: 103 mEq/L (ref 96–112)
Creatinine, Ser: 1.07 mg/dL (ref 0.50–1.10)
GFR calc Af Amer: 51 mL/min — ABNORMAL LOW (ref >90)
Glucose, Bld: 124 mg/dL — ABNORMAL HIGH (ref 70–99)
Potassium: 4.8 mEq/L (ref 3.5–5.1)

## 2011-05-08 LAB — DIFFERENTIAL
Basophils Absolute: 0 10*3/uL (ref 0.0–0.1)
Basophils Relative: 0 % (ref 0–1)
Eosinophils Relative: 0 % (ref 0–5)
Lymphocytes Relative: 6 % — ABNORMAL LOW (ref 12–46)
Lymphs Abs: 1.1 10*3/uL (ref 0.7–4.0)
Monocytes Relative: 5 % (ref 3–12)
Neutro Abs: 16 10*3/uL — ABNORMAL HIGH (ref 1.7–7.7)

## 2011-05-08 MED ORDER — IPRATROPIUM BROMIDE 0.02 % IN SOLN
0.5000 mg | Freq: Four times a day (QID) | RESPIRATORY_TRACT | Status: DC | PRN
  Filled 2011-05-08: qty 2.5

## 2011-05-08 MED ORDER — SENNA-DOCUSATE SODIUM 8.6-50 MG PO TABS: 1.0000 | ORAL_TABLET | Freq: Every day | ORAL | Status: DC

## 2011-05-08 MED ORDER — ONDANSETRON HCL 4 MG PO TABS: 4.0000 mg | ORAL_TABLET | Freq: Four times a day (QID) | ORAL | Status: DC | PRN

## 2011-05-08 MED ORDER — FENTANYL CITRATE 0.05 MG/ML IJ SOLN
50.0000 ug | Freq: Once | INTRAMUSCULAR | Status: AC
  Administered 2011-05-08: 16:00:00 via INTRAVENOUS
  Filled 2011-05-08: qty 2

## 2011-05-08 MED ORDER — LEVOTHYROXINE SODIUM 100 MCG PO TABS
100.0000 ug | ORAL_TABLET | ORAL | Status: DC
  Administered 2011-05-09 – 2011-05-13 (×5): 100 ug via ORAL
  Filled 2011-05-08 (×6): qty 1

## 2011-05-08 MED ORDER — ALBUTEROL SULFATE HFA 108 (90 BASE) MCG/ACT IN AERS
2.0000 | INHALATION_SPRAY | RESPIRATORY_TRACT | Status: DC | PRN
  Filled 2011-05-08: qty 6.7

## 2011-05-08 MED ORDER — HYDROCODONE-ACETAMINOPHEN 5-325 MG PO TABS: 1.0000 | ORAL_TABLET | ORAL | Status: DC | PRN

## 2011-05-08 MED ORDER — ZOLPIDEM TARTRATE 5 MG PO TABS
2.5000 mg | ORAL_TABLET | Freq: Every day | ORAL | Status: DC
  Administered 2011-05-08: 5 mg via ORAL
  Filled 2011-05-08: qty 1

## 2011-05-08 MED ORDER — VITAMIN K1 10 MG/ML IJ SOLN
5.0000 mg | Freq: Once | INTRAVENOUS | Status: AC
  Administered 2011-05-08: 5 mg via INTRAVENOUS
  Filled 2011-05-08: qty 0.5

## 2011-05-08 MED ORDER — FENTANYL CITRATE 0.05 MG/ML IJ SOLN
50.0000 ug | Freq: Once | INTRAMUSCULAR | Status: AC
  Administered 2011-05-08: 50 ug via INTRAVENOUS
  Filled 2011-05-08: qty 2

## 2011-05-08 MED ORDER — ACETAMINOPHEN 500 MG PO TABS: 500.0000 mg | ORAL_TABLET | Freq: Four times a day (QID) | ORAL | Status: DC | PRN

## 2011-05-08 MED ORDER — PANTOPRAZOLE SODIUM 40 MG PO TBEC
40.0000 mg | DELAYED_RELEASE_TABLET | Freq: Every day | ORAL | Status: DC
  Administered 2011-05-08 – 2011-05-13 (×7): 40 mg via ORAL
  Filled 2011-05-08 (×6): qty 1

## 2011-05-08 MED ORDER — CEFTRIAXONE SODIUM 1 G IJ SOLR
1.0000 g | INTRAMUSCULAR | Status: DC
  Administered 2011-05-08 – 2011-05-11 (×5): 1 g via INTRAVENOUS
  Filled 2011-05-08 (×6): qty 10

## 2011-05-08 MED ORDER — ALBUTEROL SULFATE (5 MG/ML) 0.5% IN NEBU: 2.5000 mg | INHALATION_SOLUTION | Freq: Four times a day (QID) | RESPIRATORY_TRACT | Status: DC | PRN

## 2011-05-08 MED ORDER — SENNOSIDES-DOCUSATE SODIUM 8.6-50 MG PO TABS
1.0000 | ORAL_TABLET | Freq: Every day | ORAL | Status: DC | PRN
  Filled 2011-05-08: qty 1

## 2011-05-08 MED ORDER — ACETAMINOPHEN 650 MG RE SUPP: 650.0000 mg | Freq: Four times a day (QID) | RECTAL | Status: DC | PRN

## 2011-05-08 MED ORDER — SODIUM CHLORIDE 0.9 % IV SOLN: INTRAVENOUS | Status: DC

## 2011-05-08 MED ORDER — FERROUS GLUCONATE 324 (38 FE) MG PO TABS
324.0000 mg | ORAL_TABLET | Freq: Two times a day (BID) | ORAL | Status: DC
  Administered 2011-05-08 – 2011-05-13 (×10): 324 mg via ORAL
  Filled 2011-05-08 (×11): qty 1

## 2011-05-08 MED ORDER — HYDROMORPHONE HCL PF 1 MG/ML IJ SOLN
1.0000 mg | INTRAMUSCULAR | Status: DC | PRN
  Administered 2011-05-09: 1 mg via INTRAVENOUS
  Filled 2011-05-08: qty 1

## 2011-05-08 MED ORDER — SENNOSIDES-DOCUSATE SODIUM 8.6-50 MG PO TABS
1.0000 | ORAL_TABLET | Freq: Every day | ORAL | Status: DC
  Administered 2011-05-08: 1 via ORAL
  Filled 2011-05-08 (×3): qty 1

## 2011-05-08 MED ORDER — ONDANSETRON HCL 4 MG/2ML IJ SOLN: 4.0000 mg | Freq: Four times a day (QID) | INTRAMUSCULAR | Status: DC | PRN

## 2011-05-08 MED ORDER — CHOLECALCIFEROL 10 MCG (400 UNIT) PO TABS
400.0000 [IU] | ORAL_TABLET | Freq: Two times a day (BID) | ORAL | Status: DC
  Administered 2011-05-08 – 2011-05-13 (×10): 400 [IU] via ORAL
  Filled 2011-05-08 (×11): qty 1

## 2011-05-08 MED ORDER — HYDROCODONE-ACETAMINOPHEN 5-325 MG PO TABS
1.0000 | ORAL_TABLET | ORAL | Status: DC | PRN
  Filled 2011-05-08: qty 1

## 2011-05-08 MED ORDER — CARBIDOPA-LEVODOPA 25-100 MG PO TABS
1.0000 | ORAL_TABLET | Freq: Three times a day (TID) | ORAL | Status: DC
  Administered 2011-05-08 – 2011-05-13 (×13): 1 via ORAL
  Filled 2011-05-08 (×16): qty 1

## 2011-05-08 MED ORDER — GUAIFENESIN-DM 100-10 MG/5ML PO SYRP: 5.0000 mL | ORAL_SOLUTION | ORAL | Status: DC | PRN

## 2011-05-08 MED ORDER — SODIUM CHLORIDE 0.9 % IV SOLN
INTRAVENOUS | Status: DC
  Administered 2011-05-08: 20:00:00 via INTRAVENOUS

## 2011-05-08 MED ORDER — ACETAMINOPHEN 325 MG PO TABS: 650.0000 mg | ORAL_TABLET | Freq: Four times a day (QID) | ORAL | Status: DC | PRN

## 2011-05-08 MED ORDER — SODIUM CHLORIDE 0.9 % IV SOLN
INTRAVENOUS | Status: DC
  Administered 2011-05-08: 15:00:00 via INTRAVENOUS

## 2011-05-08 MED ORDER — ALUM & MAG HYDROXIDE-SIMETH 200-200-20 MG/5ML PO SUSP: 30.0000 mL | Freq: Four times a day (QID) | ORAL | Status: DC | PRN

## 2011-05-08 MED ORDER — LORATADINE 10 MG PO TABS
10.0000 mg | ORAL_TABLET | Freq: Every evening | ORAL | Status: DC
  Administered 2011-05-08 – 2011-05-12 (×4): 10 mg via ORAL
  Filled 2011-05-08 (×6): qty 1

## 2011-05-08 MED ORDER — SERTRALINE HCL 25 MG PO TABS
25.0000 mg | ORAL_TABLET | Freq: Every evening | ORAL | Status: DC
  Administered 2011-05-08 – 2011-05-12 (×4): 25 mg via ORAL
  Filled 2011-05-08 (×6): qty 1

## 2011-05-08 MED ORDER — PREDNISONE 2.5 MG PO TABS
2.5000 mg | ORAL_TABLET | Freq: Every evening | ORAL | Status: DC
  Administered 2011-05-08 – 2011-05-12 (×4): 2.5 mg via ORAL
  Filled 2011-05-08 (×6): qty 1

## 2011-05-08 NOTE — ED Notes (Signed)
BJY:[id number]<FA> Expected date:05/08/11<BR> Expected time:12:11 PM<BR> Means of arrival:Ambulance<BR> Comments:<BR> EMS 40 GC. 77 y/o female. Larey Seat at [facility]. R hip pain.

## 2011-05-08 NOTE — ED Provider Notes (Signed)
History     CSN: [id number] Arrival date & time: 05/08/2011 12:52 PM   First MD Initiated Contact with Patient 05/08/11 1351      Chief Complaint  Patient presents with  . Fall    Possible right hip fx.    (Consider location/radiation/quality/duration/timing/severity/associated sxs/prior treatment) Patient is a 77 y.o. female presenting with fall. The history is provided by the patient.  Fall The accident occurred 1 to 2 hours ago. Pertinent negatives include no abdominal pain.   patient was coming from the bathroom she lost balance and fell. She is right hip pain going down to her lower leg. No numbness weakness. Series pain with movement of the hip. No other injuries. She is on Coumadin. She has a skin tear to her right lower leg. No syncope. No chest pain. No abdominal pain.  Past Medical History  Diagnosis Date  . Arthritis   . Pulmonary embolism 11/2008  . Debility 10/17/09  . Asthma   . Coagulation defect 03/24/11  . Thyroid disease   . Hypoxemia   . Pneumathemia   . Depression   . Parkinson disease   . Sacroiliitis   . Osteoporosis   . Petechiae   . Dysphagia   . Hyperlipemia   . Neuropathy   . Low vision, one eye, not otherwise specified   . Unspecified essential hypertension   . Other pulmonary embolism and infarction   . Atrial fibrillation   . Heart failure   . Phlebitis and thrombophlebitis of other deep vessels of lower extremities   . Allergic rhinitis due to pollen   . COPD (chronic obstructive pulmonary disease)   . CHF (congestive heart failure)   . Reflux esophagitis   . Inguinal hernia without mention of obstruction or gangrene, unilateral or unspecified, (not specified as recurrent)   . Unspecified constipation   . Osteoarthrosis, unspecified whether generalized or localized, unspecified site   . Pain in joint, lower leg   . Sacroiliitis, not elsewhere classified   . Muscle weakness (generalized)   . Osteoporosis, unspecified   . Pathologic  fracture of vertebrae   . Insomnia, unspecified   . Other malaise and fatigue   . Abnormality of gait   . Rash and other nonspecific skin eruption   . Edema   . Shortness of breath   . Dysphagia, oropharyngeal phase   . Unspecified urinary incontinence   . Electrocardiogram finding, abnormal, without diagnosis   . Hypoxemia   . Debility, unspecified   . Closed fracture of dorsal (thoracic) vertebra without mention of spinal cord injury   . Encounter for long-term (current) use of anticoagulants   . Kyphosis 10/13/09    kyphoplasty  . Rib fractures     Past Surgical History  Procedure Date  . Tonsillectomy and adenoidectomy   . Breast surgery 1970    Left Benign Breast Bx  . Cataract extraction, bilateral 1992/1995    IOL  . Pressure ulcer debridement     statis ulcer, right leg  . Bladder suspension 2001    AP  . Retinal tear repair cryotherapy   . Plantar fascia surgery   . Nasal sinus surgery   . Appendectomy 2004    No family history on file.  History  Substance Use Topics  . Smoking status: Not on file  . Smokeless tobacco: Not on file  . Alcohol Use:     OB History    Grav Para Term Preterm Abortions TAB SAB Ect Mult Living  Review of Systems  Constitutional: Negative for activity change and appetite change.  Eyes: Negative for pain.  Respiratory: Negative for chest tightness.   Gastrointestinal: Negative for abdominal pain.  Genitourinary: Positive for pelvic pain.  Musculoskeletal: Negative for back pain.       Right knee, right hip, right pelvic pain  Skin: Negative for pallor and rash.       Skin tear right lower leg    Allergies  Banana; Cinobac; Diflucan; Doxycycline hyclate; Mold extract; and Sulfa antibiotics  Home Medications   Current Outpatient Rx  Name Route Sig Dispense Refill  . ACETAMINOPHEN 500 MG PO TABS Oral Take 500 mg by mouth every 6 (six) hours as needed. For pain.     . ALBUTEROL SULFATE HFA 108 (90  BASE) MCG/ACT IN AERS Inhalation Inhale 2 puffs into the lungs every 4 (four) hours as needed. For shortness of breath.     . BENZOCAINE 10 % MT GEL Mouth/Throat Use as directed 1 application in the mouth or throat 4 (four) times daily as needed. Applies to sore tongue and other areas.     . CARBIDOPA-LEVODOPA 25-100 MG PO TABS Oral Take 1 tablet by mouth 3 (three) times daily.      . CHOLECALCIFEROL 400 UNITS PO TABS Oral Take 400 Units by mouth 2 (two) times daily.      Marland Kitchen EPINEPHRINE 0.3 MG/0.3ML IJ DEVI Intramuscular Inject 0.3 mg into the muscle as needed. For allergic reactions.     . FERROUS GLUCONATE 324 (38 FE) MG PO TABS Oral Take 324 mg by mouth 2 (two) times daily.      Marland Kitchen GLUCOSAMINE-CHONDROITIN 500-400 MG PO CAPS Oral Take 2 capsules by mouth every evening.      . IPRATROPIUM BROMIDE 0.02 % IN SOLN Nebulization Take 500 mcg by nebulization 2 (two) times daily.     Marland Kitchen LEVOTHYROXINE SODIUM 100 MCG PO TABS Oral Take 100 mcg by mouth every morning.      Marland Kitchen LORATADINE 10 MG PO TABS Oral Take 10 mg by mouth every evening.      Marland Kitchen OMEPRAZOLE 20 MG PO CPDR Oral Take 20 mg by mouth 2 (two) times daily.      Marland Kitchen PREDNISONE 2.5 MG PO TABS Oral Take 2.5 mg by mouth every evening. Take with food.     Marland Kitchen PRESCRIPTION MEDICATION Intramuscular Inject 0.1 mLs into the muscle once a week. Allergy Injections per protocol. She takes on Thursdays.     Marland Kitchen PSEUDOEPHEDRINE HCL 30 MG PO TABS Oral Take 30 mg by mouth 2 (two) times daily as needed. For congestion.     . SENNA-DOCUSATE SODIUM 8.6-50 MG PO TABS Oral Take 1 tablet by mouth at bedtime.      . SERTRALINE HCL 25 MG PO TABS Oral Take 25 mg by mouth every evening.      . WARFARIN SODIUM 2 MG PO TABS Oral Take by mouth. She takes one tablet with a 5mg  tablet to equal 7mg  on Monday, Wednesday and Friday.    . WARFARIN SODIUM 5 MG PO TABS Oral Take by mouth. She takes one tablet every evening on Tuesday, Thursday, Saturday, Sunday and takes one tablet with a 2mg   tablet to equal 7mg  on Monday, Wednesday and Friday.    Marland Kitchen ZOLPIDEM TARTRATE 5 MG PO TABS Oral Take 2.5 mg by mouth at bedtime.        BP 140/74  Pulse 103  Temp(Src) 98.8 F (37.1 C) (Oral)  Resp  21  SpO2 98%  Physical Exam  ED Course  Procedures (including critical care time)   Labs Reviewed  CBC  DIFFERENTIAL  BASIC METABOLIC PANEL  PROTIME-INR  APTT  URINALYSIS, ROUTINE W REFLEX MICROSCOPIC   No results found.   No diagnosis found.   Date: 05/08/2011  Rate: 99  Rhythm: normal sinus rhythm  QRS Axis: normal  Intervals: normal  ST/T Wave abnormalities: normal  Conduction Disutrbances:none  Narrative Interpretation:   Old EKG Reviewed: unchanged    MDM  Fall with right hip pain. She has a femoral neck fracture. She has multiple medical problems and requires medicine admission. [facility] will be [facility] with an [facility]opedic consult. I discussed the case with Dr. Carola Frost.        Juliet Rude. Rubin Payor, MD 05/08/11 [phone]

## 2011-05-08 NOTE — Consult Note (Signed)
[place] H Allinson is an 77 y.o. female. ZOX:[id number]   Chief Complaint: R hip pain HPI: 77 yo c/o r hip pain since falling several times earlier today.  Multiple medical problems. Resident of SNF w limited ambulation from bed to BR, walker, and electric wheelchair.    Past Medical History  Diagnosis Date  . Arthritis   . Pulmonary embolism 11/2008  . Debility 10/17/09  . Asthma   . Coagulation defect 03/24/11  . Thyroid disease   . Hypoxemia   . Pneumathemia   . Depression   . Parkinson disease   . Sacroiliitis   . Osteoporosis   . Petechiae   . Dysphagia   . Hyperlipemia   . Neuropathy   . Low vision, one eye, not otherwise specified   . Unspecified essential hypertension   . Other pulmonary embolism and infarction   . Atrial fibrillation   . Heart failure   . Phlebitis and thrombophlebitis of other deep vessels of lower extremities   . Allergic rhinitis due to pollen   . COPD (chronic obstructive pulmonary disease)   . CHF (congestive heart failure)   . Reflux esophagitis   . Inguinal hernia without mention of obstruction or gangrene, unilateral or unspecified, (not specified as recurrent)   . Unspecified constipation   . Osteoarthrosis, unspecified whether generalized or localized, unspecified site   . Pain in joint, lower leg   . Sacroiliitis, not elsewhere classified   . Muscle weakness (generalized)   . Osteoporosis, unspecified   . Pathologic fracture of vertebrae   . Insomnia, unspecified   . Other malaise and fatigue   . Abnormality of gait   . Rash and other nonspecific skin eruption   . Edema   . Shortness of breath   . Dysphagia, oropharyngeal phase   . Unspecified urinary incontinence   . Electrocardiogram finding, abnormal, without diagnosis   . Hypoxemia   . Debility, unspecified   . Closed fracture of dorsal (thoracic) vertebra without mention of spinal cord injury   . Encounter for long-term (current) use of anticoagulants   . Kyphosis 10/13/09    kyphoplasty  . Rib fractures   . Complication of anesthesia     PNA s/p surgeries  . Hypothyroidism   . Blood transfusion   . GERD (gastroesophageal reflux disease)   . Pneumonia     Past Surgical History  Procedure Date  . Tonsillectomy and adenoidectomy   . Breast surgery 1970    Left Benign Breast Bx  . Cataract extraction, bilateral 1992/1995    IOL  . Pressure ulcer debridement     statis ulcer, right leg  . Bladder suspension 2001    AP  . Retinal tear repair cryotherapy   . Plantar fascia surgery   . Nasal sinus surgery   . Appendectomy 2004  . Back surgery     History reviewed. No pertinent family history. Social History:  reports that she quit smoking about 35 years ago. She does not have any smokeless tobacco history on file. She reports that she drinks about .6 ounces of alcohol per week. She reports that she does not use illicit drugs.  Allergies:  Allergies  Allergen Reactions  . Banana Other (See Comments)    Reaction unknown  . Cinobac (Cinoxacin) Other (See Comments)    Reaction unknown   . Diflucan Other (See Comments)    Reaction unknown   . Doxycycline Hyclate Other (See Comments)    Reaction unknown   . Mold  Extract (Trichophyton Mentagrophyte) Other (See Comments)    Reaction unknown   . Sulfa Antibiotics Other (See Comments)    Reaction unknown     Medications Prior to Admission  Medication Dose Route Frequency Provider Last Rate Last Dose  . 0.9 %  sodium chloride infusion   Intravenous Continuous Ripudeep Rai      . acetaminophen (TYLENOL) tablet 650 mg  650 mg Oral Q6H PRN Ripudeep Rai       Or  . acetaminophen (TYLENOL) suppository 650 mg  650 mg Rectal Q6H PRN Ripudeep Rai      . acetaminophen (TYLENOL) tablet 500 mg  500 mg Oral Q6H PRN Ripudeep Rai      . albuterol (PROVENTIL HFA;VENTOLIN HFA) 108 (90 BASE) MCG/ACT inhaler 2 puff  2 puff Inhalation Q4H PRN Ripudeep Rai      . albuterol (PROVENTIL) (5 MG/ML) 0.5% nebulizer  solution 2.5 mg  2.5 mg Nebulization Q6H PRN Ripudeep Rai      . alum & mag hydroxide-simeth (MAALOX/MYLANTA) 200-200-20 MG/5ML suspension 30 mL  30 mL Oral Q6H PRN Ripudeep Rai      . carbidopa-levodopa (SINEMET) 25-100 MG per tablet 1 tablet  1 tablet Oral TID Ripudeep Rai      . cefTRIAXone (ROCEPHIN) 1 g in dextrose 5 % 50 mL IVPB  1 g Intravenous Q24H Ripudeep Rai      . cholecalciferol (VITAMIN D) tablet 400 Units  400 Units Oral BID Ripudeep Rai      . fentaNYL (SUBLIMAZE) injection 50 mcg  50 mcg Intravenous Once [employer]. Pickering, MD   50 mcg at 05/08/11 1415  . fentaNYL (SUBLIMAZE) injection 50 mcg  50 mcg Intravenous Once [employer]. Pickering, MD      . ferrous gluconate [facility]) tablet 324 mg  324 mg Oral BID Ripudeep Rai      . guaiFENesin-dextromethorphan (ROBITUSSIN DM) 100-10 MG/5ML syrup 5 mL  5 mL Oral Q4H PRN Ripudeep Rai      . HYDROcodone-acetaminophen (NORCO) 5-325 MG per tablet 1-2 tablet  1-2 tablet Oral Q4H PRN Ripudeep Rai      . HYDROmorphone (DILAUDID) injection 1 mg  1 mg Intravenous Q4H PRN Ripudeep Rai      . ipratropium (ATROVENT) nebulizer solution 0.5 mg  0.5 mg Nebulization Q6H PRN Ripudeep Rai      . levothyroxine (SYNTHROID, LEVOTHROID) tablet 100 mcg  100 mcg Oral QAM Ripudeep Rai      . loratadine (CLARITIN) tablet 10 mg  10 mg Oral QPM Ripudeep Rai      . ondansetron (ZOFRAN) tablet 4 mg  4 mg Oral Q6H PRN Ripudeep Rai       Or  . ondansetron (ZOFRAN) injection 4 mg  4 mg Intravenous Q6H PRN Ripudeep Rai      . pantoprazole (PROTONIX) EC tablet 40 mg  40 mg Oral Q1200 Ripudeep Rai      . phytonadione (VITAMIN K) 5 mg in dextrose 5 % 50 mL IVPB  5 mg Intravenous Once Ripudeep Rai      . predniSONE (DELTASONE) tablet 2.5 mg  2.5 mg Oral QPM Ripudeep Rai      . senna-docusate (Senokot-S) tablet 1 tablet  1 tablet Oral Daily PRN Ripudeep Rai      . senna-docusate (Senokot-S) tablet 1 tablet  1 tablet Oral QHS Randall K Absher, PHARMD      . sertraline  (ZOLOFT) tablet 25 mg  25 mg Oral QPM Ripudeep Rai      .  zolpidem (AMBIEN) tablet 2.5 mg  2.5 mg Oral QHS Ripudeep Rai      . DISCONTD: 0.9 %  sodium chloride infusion   Intravenous Continuous Juliet Rude. Rubin Payor, MD 125 mL/hr at 05/08/11 1450    . DISCONTD: 0.9 %  sodium chloride infusion   Intravenous STAT Nathan R. Pickering, MD      . DISCONTD: 0.9 %  sodium chloride infusion   Intravenous Continuous Ripudeep Rai      . DISCONTD: sennosides-docusate sodium (SENOKOT-S) 8.6-50 MG tablet 1 tablet  1 tablet Oral QHS Ripudeep Rai       No current outpatient prescriptions on file as of 05/08/2011.    Results for orders placed during the hospital encounter of 05/08/11 (from the past 48 hour(s))  CBC     Status: Abnormal   Collection Time   05/08/11  2:38 PM      Component Value Range Comment   WBC 18.0 (*) 4.0 - 10.5 (K/uL)    RBC 4.79  3.87 - 5.11 (MIL/uL)    Hemoglobin 12.7  12.0 - 15.0 (g/dL)    HCT 16.1  09.6 - 04.5 (%)    MCV 84.6  78.0 - 100.0 (fL)    MCH 26.5  26.0 - 34.0 (pg)    MCHC 31.4  30.0 - 36.0 (g/dL)    RDW 40.9 (*) 81.1 - 15.5 (%)    Platelets 213  150 - 400 (K/uL)   DIFFERENTIAL     Status: Abnormal   Collection Time   05/08/11  2:38 PM      Component Value Range Comment   Neutrophils Relative 89 (*) 43 - 77 (%)    Lymphocytes Relative 6 (*) 12 - 46 (%)    Monocytes Relative 5  3 - 12 (%)    Eosinophils Relative 0  0 - 5 (%)    Basophils Relative 0  0 - 1 (%)    Neutro Abs 16.0 (*) 1.7 - 7.7 (K/uL)    Lymphs Abs 1.1  0.7 - 4.0 (K/uL)    Monocytes Absolute 0.9  0.1 - 1.0 (K/uL)    Eosinophils Absolute 0.0  0.0 - 0.7 (K/uL)    Basophils Absolute 0.0  0.0 - 0.1 (K/uL)    Smear Review MORPHOLOGY UNREMARKABLE     BASIC METABOLIC PANEL     Status: Abnormal   Collection Time   05/08/11  2:38 PM      Component Value Range Comment   Sodium 139  135 - 145 (mEq/L)    Potassium 4.8  3.5 - 5.1 (mEq/L)    Chloride 103  96 - 112 (mEq/L)    CO2 26  19 - 32 (mEq/L)     Glucose, Bld 124 (*) 70 - 99 (mg/dL)    BUN 22  6 - 23 (mg/dL)    Creatinine, Ser 9.14  0.50 - 1.10 (mg/dL)    Calcium 78.2  8.4 - 10.5 (mg/dL)    GFR calc non Af Amer 44 (*) >90 (mL/min)    GFR calc Af Amer 51 (*) >90 (mL/min)   PROTIME-INR     Status: Abnormal   Collection Time   05/08/11  2:38 PM      Component Value Range Comment   Prothrombin Time 23.3 (*) 11.6 - 15.2 (seconds)    INR 2.03 (*) 0.00 - 1.49    APTT     Status: Normal   Collection Time   05/08/11  2:38 PM  Component Value Range Comment   aPTT 28  24 - 37 (seconds)   URINALYSIS, ROUTINE W REFLEX MICROSCOPIC     Status: Abnormal   Collection Time   05/08/11  3:57 PM      Component Value Range Comment   Color, Urine YELLOW  YELLOW     Appearance CLEAR  CLEAR     Specific Gravity, Urine 1.012  1.005 - 1.030     pH 5.5  5.0 - 8.0     Glucose, UA NEGATIVE  NEGATIVE (mg/dL)    Hgb urine dipstick LARGE (*) NEGATIVE     Bilirubin Urine NEGATIVE  NEGATIVE     Ketones, ur NEGATIVE  NEGATIVE (mg/dL)    Protein, ur NEGATIVE  NEGATIVE (mg/dL)    Urobilinogen, UA 0.2  0.0 - 1.0 (mg/dL)    Nitrite NEGATIVE  NEGATIVE     Leukocytes, UA TRACE (*) NEGATIVE    URINE MICROSCOPIC-ADD ON     Status: Abnormal   Collection Time   05/08/11  3:57 PM      Component Value Range Comment   Squamous Epithelial / LPF RARE  RARE     WBC, UA 3-6  <3 (WBC/hpf)    RBC / HPF 7-10  <3 (RBC/hpf)    Bacteria, UA MANY (*) RARE     Dg Chest 1 View  05/08/2011  *RADIOLOGY REPORT*  Clinical Data: Fall, right hip deformity  CHEST - 1 VIEW  Comparison: 10/11/2009  Findings: Chronic interstitial markings.  Left basilar atelectasis / scarring. No pleural effusion or pneumothorax.  The heart is top normal in size.  Prior lower thoracolumbar vertebral augmentation.  IMPRESSION: No evidence of acute cardiopulmonary disease.  Original Report Authenticated By: Charline Bills, M.D.   Dg Hip Complete Right  05/08/2011  *RADIOLOGY REPORT*  Clinical  Data: Fall, right hip deformity  RIGHT HIP - COMPLETE 2+ VIEW  Comparison: None.  Findings: Mildly comminuted right femoral neck fracture with foreshortening/varus deformity.  No additional fractures are seen.  Prior vertebral augmentation of the lower lumbar spine.  IMPRESSION: Comminuted right femoral neck fracture with foreshortening/varus deformity.  Original Report Authenticated By: Charline Bills, M.D.   Dg Knee 1-2 Views Right  05/08/2011  *RADIOLOGY REPORT*  Clinical Data: Fall  RIGHT KNEE - 1-2 VIEW  Comparison: None.  Findings: No fracture or dislocation is seen.  Tricompartmental degenerative changes.  The visualized soft tissues are unremarkable.  No suprapatellar knee joint effusion.  IMPRESSION: No fracture or dislocation is seen.  Tricompartmental degenerative changes.  Original Report Authenticated By: Charline Bills, M.D.      Blood pressure 173/93, pulse 108, temperature 97.8 F (36.6 C), temperature source Oral, resp. rate 20, height 5\' 6"  (1.676 m), weight 83.8 kg (184 lb 11.9 oz), SpO2 94.00%. Exam: A&Ox4; very pleasant and conversant. Morbid obesity. Normocephalic, atraumatic, glasses; UEx-scattered ecchymosis but no tenderness, skin tears, block to motion or instability; RRR, no wheezing, abd NT, soft; L troch tenderness, no ecchymosis. R hip tenderness; distally no knee, ankle tenderness or instability; intact sensation and motor DPN,SPN,TN; dp 2+ and PT 2+. No edema and excellent skin condition. Large skin tear left shin, dressed, and small skin tear subacute L, dressed.  Assessment/Plan R femoral neck fracture, displaced; in min ambulatory coherent 77 yo female; recommend r hip hemiarthroplasty for pain control, mobility to chair, reduce narcotics;  Will recheck inr in the am and may reverse if not trending down significantly. Surgery as soon as lab corrects sufficiently.  I discussed  with the patient and her family the risks and benefits of surgery, including the  possibility of infection, nerve injury, vessel injury, wound breakdown, instability, leg length inequality, DVT/ PE, loss of motion, and need for further surgery among others.  She and her family understood these risks and wished to proceed.   Braniya Farrugia H 05/08/2011, 7:24 PM

## 2011-05-08 NOTE — ED Notes (Signed)
Pt transported to xray 

## 2011-05-08 NOTE — ED Notes (Signed)
Pt was standing and turned to the right was hung on carpet and fell on right hip. Right hip shorten with no pain present. 20g Left FA placed. No LOC.

## 2011-05-08 NOTE — H&P (Signed)
PCP:  Dr. Murray Hodgkins  Chief Complaint:  Mechanical fall x3 with right hip pain today  HPI: Patient is 77 year old Caucasian female with multiple medical problems including history of pulmonary embolism on Coumadin, history of COPD, history of hypothyroidism, history of depression, Parkinson's disease, generalized debility, arthritis, GERD, history of questionable dysphagia presented to the [facility] with mechanical fall today. Patient is a resident of skilled nursing facility. History was obtained from the daughter and the patient. Per the patient, she had the first fall today while coming from the bathroom. Patient states that she was assisted by the nursing facility staff, however, she fell back again. She was assisted again by the nursing facility staff and patient was being brought to her recliner, then her knees buckled and she fell again. She denied any nausea, vomiting, any dizziness or any syncopal episode. patient did not lose any consciousness today while having mechanical falls. She denied any chest pain, shortness of breath or any palpitations.  Review of Systems:  Constitutional: Denies fever, chills, diaphoresis, appetite change and fatigue.  HEENT: Denies photophobia, eye pain, redness, hearing loss, ear pain, congestion, sore throat, rhinorrhea, sneezing, mouth sores, trouble swallowing, neck pain, neck stiffness and tinnitus.   Respiratory: Denies SOB, DOE, cough, chest tightness,  and wheezing.   Cardiovascular: Denies chest pain, palpitations and leg swelling.  Gastrointestinal: Denies nausea, vomiting, abdominal pain, diarrhea, constipation, blood in stool and abdominal distention.  Genitourinary: Denies dysuria, urgency, frequency, hematuria, flank pain and difficulty urinating.  Musculoskeletal: See history of present illness Skin: Denies pallor, rash and wound.  patient has abrasions on her right lower extremity from the fall today Neurological: Denies dizziness,  seizures, syncope, weakness, light-headedness, numbness and headaches.  Hematological: Denies adenopathy. Easy bruising, personal or family bleeding history  Psychiatric/Behavioral: Denies suicidal ideation, mood changes, confusion, nervousness, sleep disturbance and agitation  Past Medical History: Past Medical History  Diagnosis Date  . Arthritis   . Pulmonary embolism 11/2008  . Debility 10/17/09  . Asthma   . Coagulation defect 03/24/11  . Thyroid disease   . Hypoxemia   . Pneumathemia   . Depression   . Parkinson disease   . Sacroiliitis   . Osteoporosis   . Petechiae   . Dysphagia   . Hyperlipemia   . Neuropathy   . Low vision, one eye, not otherwise specified   . Unspecified essential hypertension   . Other pulmonary embolism and infarction   . Atrial fibrillation   . Heart failure   . Phlebitis and thrombophlebitis of other deep vessels of lower extremities   . Allergic rhinitis due to pollen   . COPD (chronic obstructive pulmonary disease)   . CHF (congestive heart failure)   . Reflux esophagitis   . Inguinal hernia without mention of obstruction or gangrene, unilateral or unspecified, (not specified as recurrent)   . Unspecified constipation   . Osteoarthrosis, unspecified whether generalized or localized, unspecified site   . Pain in joint, lower leg   . Sacroiliitis, not elsewhere classified   . Muscle weakness (generalized)   . Osteoporosis, unspecified   . Pathologic fracture of vertebrae   . Insomnia, unspecified   . Other malaise and fatigue   . Abnormality of gait   . Rash and other nonspecific skin eruption   . Edema   . Shortness of breath   . Dysphagia, oropharyngeal phase   . Unspecified urinary incontinence   . Electrocardiogram finding, abnormal, without diagnosis   . Hypoxemia   .  Debility, unspecified   . Closed fracture of dorsal (thoracic) vertebra without mention of spinal cord injury   . Encounter for long-term (current) use of  anticoagulants   . Kyphosis 10/13/09    kyphoplasty  . Rib fractures    Past Surgical History  Procedure Date  . Tonsillectomy and adenoidectomy   . Breast surgery 1970    Left Benign Breast Bx  . Cataract extraction, bilateral 1992/1995    IOL  . Pressure ulcer debridement     statis ulcer, right leg  . Bladder suspension 2001    AP  . Retinal tear repair cryotherapy   . Plantar fascia surgery   . Nasal sinus surgery   . Appendectomy 2004    Medications: Prior to Admission medications   Medication Sig Start Date End Date Taking? Authorizing Provider  acetaminophen (MAPAP) 500 MG tablet Take 500 mg by mouth every 6 (six) hours as needed. For pain.    Yes Historical Provider, MD  albuterol (PROVENTIL HFA;VENTOLIN HFA) 108 (90 BASE) MCG/ACT inhaler Inhale 2 puffs into the lungs every 4 (four) hours as needed. For shortness of breath.    Yes Historical Provider, MD  benzocaine (ORAJEL) 10 % mucosal gel Use as directed 1 application in the mouth or throat 4 (four) times daily as needed. Applies to sore tongue and other areas.    Yes Historical Provider, MD  carbidopa-levodopa (SINEMET) 25-100 MG per tablet Take 1 tablet by mouth 3 (three) times daily.     Yes Historical Provider, MD  cholecalciferol (VITAMIN D) 400 UNITS TABS Take 400 Units by mouth 2 (two) times daily.     Yes Historical Provider, MD  EPINEPHrine (EPI-PEN) 0.3 mg/0.3 mL DEVI Inject 0.3 mg into the muscle as needed. For allergic reactions.    Yes Historical Provider, MD  ferrous gluconate (FERGON) 324 MG tablet Take 324 mg by mouth 2 (two) times daily.     Yes Historical Provider, MD  Glucosamine-Chondroitin (GLUCOSAMINE CHONDR COMPLEX) 500-400 MG CAPS Take 2 capsules by mouth every evening.     Yes Historical Provider, MD  ipratropium (ATROVENT) 0.02 % nebulizer solution Take 500 mcg by nebulization 2 (two) times daily.    Yes Historical Provider, MD  levothyroxine (SYNTHROID, LEVOTHROID) 100 MCG tablet Take 100 mcg by  mouth every morning.     Yes Historical Provider, MD  loratadine (CLARITIN) 10 MG tablet Take 10 mg by mouth every evening.     Yes Historical Provider, MD  omeprazole (PRILOSEC) 20 MG capsule Take 20 mg by mouth 2 (two) times daily.     Yes Historical Provider, MD  predniSONE (DELTASONE) 2.5 MG tablet Take 2.5 mg by mouth every evening. Take with food.    Yes Historical Provider, MD  PRESCRIPTION MEDICATION Inject 0.1 mLs into the muscle once a week. Allergy Injections per protocol. She takes on Thursdays.    Yes Historical Provider, MD  pseudoephedrine (SUDOGEST) 30 MG tablet Take 30 mg by mouth 2 (two) times daily as needed. For congestion.    Yes Historical Provider, MD  sennosides-docusate sodium (SENOKOT-S) 8.6-50 MG tablet Take 1 tablet by mouth at bedtime.     Yes Historical Provider, MD  sertraline (ZOLOFT) 25 MG tablet Take 25 mg by mouth every evening.     Yes Historical Provider, MD  warfarin (COUMADIN) 2 MG tablet Take by mouth. She takes one tablet with a 5mg  tablet to equal 7mg  on Monday, Wednesday and Friday.   Yes Historical Provider, MD  warfarin (  COUMADIN) 5 MG tablet Take by mouth. She takes one tablet every evening on Tuesday, Thursday, Saturday, Sunday and takes one tablet with a 2mg  tablet to equal 7mg  on Monday, Wednesday and Friday.   Yes Historical Provider, MD  zolpidem (AMBIEN) 5 MG tablet Take 2.5 mg by mouth at bedtime.     Yes Historical Provider, MD    Allergies:   Allergies  Allergen Reactions  . Banana Other (See Comments)    Reaction unknown  . Cinobac (Cinoxacin) Other (See Comments)    Reaction unknown   . Diflucan Other (See Comments)    Reaction unknown   . Doxycycline Hyclate Other (See Comments)    Reaction unknown   . Mold Extract (Trichophyton Mentagrophyte) Other (See Comments)    Reaction unknown   . Sulfa Antibiotics Other (See Comments)    Reaction unknown     Social History:  does not have a smoking history on file. She does not  have any smokeless tobacco history on file. Her alcohol and drug histories not on file. Patient is a resident of [facility] skilled nursing facility, patient uses walker for ambulation. At her baseline patient is able to walk only from the bedroom to the bathroom with her walker at other times, she also uses an Mining engineer wheelchair.   Family History: No family history on file.  Physical Exam: Blood pressure 136/77, pulse 105, temperature 98.8 F (37.1 C), temperature source Oral, resp. rate 19, SpO2 95.00%. General: Alert, awake, oriented x3, in no acute distress. HEENT: anicteric sclera, pink conjunctiva, pupils equal and reactive to light and accomodation Neck: supple, no masses or lymphadenopathy, no goiter, no bruits  Heart: Regular rate and rhythm, without murmurs, rubs or gallops. Lungs: Clear to auscultation bilaterally, no wheezing, rales or rhonchi. Abdomen: Soft, nontender, nondistended, positive bowel sounds, no masses. Extremities: No clubbing,or edema with positive pedal pulses. Abrasions on the right lower extremity Neuro: Grossly intact, no focal neurological deficits, strength 5/5 upper and lower extremities bilaterally Psych: alert and oriented x 3, normal mood and affect Skin: no rashes or lesions, warm and dry   Labs on Admission:  Results for orders placed during the hospital encounter of 05/08/11 (from the past 24 hour(s))  CBC     Status: Abnormal   Collection Time   05/08/11  2:38 PM      Component Value Range   WBC 18.0 (*) 4.0 - 10.5 (K/uL)   RBC 4.79  3.87 - 5.11 (MIL/uL)   Hemoglobin 12.7  12.0 - 15.0 (g/dL)   HCT 16.1  09.6 - 04.5 (%)   MCV 84.6  78.0 - 100.0 (fL)   MCH 26.5  26.0 - 34.0 (pg)   MCHC 31.4  30.0 - 36.0 (g/dL)   RDW 40.9 (*) 81.1 - 15.5 (%)   Platelets 213  150 - 400 (K/uL)  DIFFERENTIAL     Status: Abnormal   Collection Time   05/08/11  2:38 PM      Component Value Range   Neutrophils Relative 89 (*) 43 - 77 (%)   Lymphocytes  Relative 6 (*) 12 - 46 (%)   Monocytes Relative 5  3 - 12 (%)   Eosinophils Relative 0  0 - 5 (%)   Basophils Relative 0  0 - 1 (%)   Neutro Abs 16.0 (*) 1.7 - 7.7 (K/uL)   Lymphs Abs 1.1  0.7 - 4.0 (K/uL)   Monocytes Absolute 0.9  0.1 - 1.0 (K/uL)   Eosinophils Absolute  0.0  0.0 - 0.7 (K/uL)   Basophils Absolute 0.0  0.0 - 0.1 (K/uL)   Smear Review MORPHOLOGY UNREMARKABLE    BASIC METABOLIC PANEL     Status: Abnormal   Collection Time   05/08/11  2:38 PM      Component Value Range   Sodium 139  135 - 145 (mEq/L)   Potassium 4.8  3.5 - 5.1 (mEq/L)   Chloride 103  96 - 112 (mEq/L)   CO2 26  19 - 32 (mEq/L)   Glucose, Bld 124 (*) 70 - 99 (mg/dL)   BUN 22  6 - 23 (mg/dL)   Creatinine, Ser 0.98  0.50 - 1.10 (mg/dL)   Calcium 11.9  8.4 - 10.5 (mg/dL)   GFR calc non Af Amer 44 (*) >90 (mL/min)   GFR calc Af Amer 51 (*) >90 (mL/min)  PROTIME-INR     Status: Abnormal   Collection Time   05/08/11  2:38 PM      Component Value Range   Prothrombin Time 23.3 (*) 11.6 - 15.2 (seconds)   INR 2.03 (*) 0.00 - 1.49   APTT     Status: Normal   Collection Time   05/08/11  2:38 PM      Component Value Range   aPTT 28  24 - 37 (seconds)  URINALYSIS, ROUTINE W REFLEX MICROSCOPIC     Status: Abnormal   Collection Time   05/08/11  3:57 PM      Component Value Range   Color, Urine YELLOW  YELLOW    Appearance CLEAR  CLEAR    Specific Gravity, Urine 1.012  1.005 - 1.030    pH 5.5  5.0 - 8.0    Glucose, UA NEGATIVE  NEGATIVE (mg/dL)   Hgb urine dipstick LARGE (*) NEGATIVE    Bilirubin Urine NEGATIVE  NEGATIVE    Ketones, ur NEGATIVE  NEGATIVE (mg/dL)   Protein, ur NEGATIVE  NEGATIVE (mg/dL)   Urobilinogen, UA 0.2  0.0 - 1.0 (mg/dL)   Nitrite NEGATIVE  NEGATIVE    Leukocytes, UA TRACE (*) NEGATIVE   URINE MICROSCOPIC-ADD ON     Status: Abnormal   Collection Time   05/08/11  3:57 PM      Component Value Range   Squamous Epithelial / LPF RARE  RARE    WBC, UA 3-6  <3 (WBC/hpf)   RBC / HPF  7-10  <3 (RBC/hpf)   Bacteria, UA MANY (*) RARE     Radiological Exams on Admission: Dg Chest 1 View  05/08/2011  *RADIOLOGY REPORT*  Clinical Data: Fall, right hip deformity  CHEST - 1 VIEW  Comparison: 10/11/2009  Findings: Chronic interstitial markings.  Left basilar atelectasis / scarring. No pleural effusion or pneumothorax.  The heart is top normal in size.  Prior lower thoracolumbar vertebral augmentation.  IMPRESSION: No evidence of acute cardiopulmonary disease.  Original Report Authenticated By: Charline Bills, M.D.   Dg Hip Complete Right  05/08/2011  *RADIOLOGY REPORT*  Clinical Data: Fall, right hip deformity  RIGHT HIP - COMPLETE 2+ VIEW  Comparison: None.  Findings: Mildly comminuted right femoral neck fracture with foreshortening/varus deformity.  No additional fractures are seen.  Prior vertebral augmentation of the lower lumbar spine.  IMPRESSION: Comminuted right femoral neck fracture with foreshortening/varus deformity.  Original Report Authenticated By: Charline Bills, M.D.   Dg Knee 1-2 Views Right  05/08/2011  *RADIOLOGY REPORT*  Clinical Data: Fall  RIGHT KNEE - 1-2 VIEW  Comparison: None.  Findings: No fracture  or dislocation is seen.  Tricompartmental degenerative changes.  The visualized soft tissues are unremarkable.  No suprapatellar knee joint effusion.  IMPRESSION: No fracture or dislocation is seen.  Tricompartmental degenerative changes.  Original Report Authenticated By: Charline Bills, M.D.    Assessment/Plan Present on Admission:  .Hip fracture, right/ fall:  - Admit to medicine service, she is a moderate to high risk for surgery given her multiple medical problems, poor overall functional status, generalized deconditioning, however for the quality of life and the pain, cleared for orthopedic surgery - Will place her on telemetry monitored floor, and and orthopedic service (Dr Carola Frost)  has been consulted by the ER physician (Dr Rubin Payor). - INR  currently therapeutically on Coumadin, hold Coumadin tonight. - Patient resides in skilled nursing facility currently.  -) COPD: Currently stable, will continue albuterol and Atrovent nebulizers  -) Gastroesophageal reflux: Will continue proton pump inhibitor  -) History of Parkinson's disease will continue Sinemet  -) History of pulmonary embolism: On Coumadin, hold Coumadin tonight for the surgery. INR therapeutic, will give her 5 mg IV vitamin K to reverse the Coumadin.  -) question of dysphagia: Patient states that she eats regular diet, will monitor closely   -)  Hypothyroidism: Obtain TSH, continue Synthroid  -) UTI: Urine culture will be obtained start patient on Rocephin  DVT prophylaxis: TED hoses for now, INR therapeutic, will restart Coumadin after the surgery  CODE STATUS discussed in detail with the patient and her daughter are present in the room per her living will and patient's wishes she is DO NOT RESUSCITATE   @Time  Spent on Admission: 1 hour  RAI,RIPUDEEP 05/08/2011, 5:00 PM

## 2011-05-08 NOTE — ED Notes (Signed)
Skin tear to right shin.

## 2011-05-09 ENCOUNTER — Encounter (HOSPITAL_COMMUNITY): Payer: Self-pay | Admitting: Anesthesiology

## 2011-05-09 ENCOUNTER — Encounter (HOSPITAL_COMMUNITY)
Admission: EM | Disposition: A | Discharge status: Skilled Nursing Facility | Payer: Self-pay | Source: Home / Self Care | Attending: Internal Medicine

## 2011-05-09 ENCOUNTER — Inpatient Hospital Stay (HOSPITAL_COMMUNITY): Payer: Medicare Other | Admitting: Anesthesiology

## 2011-05-09 ENCOUNTER — Inpatient Hospital Stay (HOSPITAL_COMMUNITY): Payer: Medicare Other

## 2011-05-09 HISTORY — PX: HIP ARTHROPLASTY: SHX981

## 2011-05-09 LAB — TSH: TSH: 3.803 u[IU]/mL (ref 0.350–4.500)

## 2011-05-09 LAB — BASIC METABOLIC PANEL
BUN: 19 mg/dL (ref 6–23)
Calcium: 9.5 mg/dL (ref 8.4–10.5)
Chloride: 104 mEq/L (ref 96–112)
Creatinine, Ser: 1.04 mg/dL (ref 0.50–1.10)
GFR calc Af Amer: 52 mL/min — ABNORMAL LOW (ref >90)
GFR calc non Af Amer: 45 mL/min — ABNORMAL LOW (ref >90)

## 2011-05-09 LAB — PROTIME-INR: INR: 1.44 (ref 0.00–1.49)

## 2011-05-09 LAB — CBC
HCT: 40.5 % (ref 36.0–46.0)
MCHC: 30.1 g/dL (ref 30.0–36.0)
Platelets: 194 10*3/uL (ref 150–400)
RDW: 26 % — ABNORMAL HIGH (ref 11.5–15.5)
WBC: 11.8 10*3/uL — ABNORMAL HIGH (ref 4.0–10.5)

## 2011-05-09 LAB — TYPE AND SCREEN: ABO/RH(D): O POS

## 2011-05-09 SURGERY — HEMIARTHROPLASTY, HIP, DIRECT ANTERIOR APPROACH, FOR FRACTURE
Anesthesia: General | Site: Hip | Laterality: Right | Wound class: Clean

## 2011-05-09 MED ORDER — GLYCOPYRROLATE 0.2 MG/ML IJ SOLN
INTRAMUSCULAR | Status: DC | PRN
  Administered 2011-05-09: .6 mg via INTRAVENOUS

## 2011-05-09 MED ORDER — MORPHINE SULFATE 2 MG/ML IJ SOLN
1.0000 mg | INTRAMUSCULAR | Status: DC | PRN
  Administered 2011-05-10 – 2011-05-11 (×2): 2 mg via INTRAVENOUS
  Filled 2011-05-09 (×2): qty 1

## 2011-05-09 MED ORDER — CEFAZOLIN SODIUM 1-5 GM-% IV SOLN: 1.0000 g | Freq: Once | INTRAVENOUS | Status: DC

## 2011-05-09 MED ORDER — FENTANYL CITRATE 0.05 MG/ML IJ SOLN
INTRAMUSCULAR | Status: DC | PRN
  Administered 2011-05-09 (×4): 50 ug via INTRAVENOUS

## 2011-05-09 MED ORDER — ESMOLOL HCL 10 MG/ML IV SOLN
INTRAVENOUS | Status: DC | PRN
  Administered 2011-05-09: 20 mg via INTRAVENOUS

## 2011-05-09 MED ORDER — ENOXAPARIN SODIUM 40 MG/0.4ML ~~LOC~~ SOLN: 40.0000 mg | SUBCUTANEOUS | Status: DC

## 2011-05-09 MED ORDER — DEXAMETHASONE SODIUM PHOSPHATE 4 MG/ML IJ SOLN
INTRAMUSCULAR | Status: DC | PRN
  Administered 2011-05-09: 8 mg via INTRAVENOUS

## 2011-05-09 MED ORDER — ACETAMINOPHEN 10 MG/ML IV SOLN
1000.0000 mg | Freq: Four times a day (QID) | INTRAVENOUS | Status: AC
  Administered 2011-05-09 – 2011-05-10 (×4): 1000 mg via INTRAVENOUS
  Filled 2011-05-09 (×4): qty 100

## 2011-05-09 MED ORDER — PROPOFOL 10 MG/ML IV EMUL
INTRAVENOUS | Status: DC | PRN
  Administered 2011-05-09: 100 mg via INTRAVENOUS

## 2011-05-09 MED ORDER — METHOCARBAMOL 500 MG PO TABS
500.0000 mg | ORAL_TABLET | Freq: Four times a day (QID) | ORAL | Status: DC | PRN
  Administered 2011-05-11: 500 mg via ORAL
  Filled 2011-05-09: qty 1

## 2011-05-09 MED ORDER — ENOXAPARIN SODIUM 40 MG/0.4ML ~~LOC~~ SOLN
40.0000 mg | SUBCUTANEOUS | Status: DC
  Filled 2011-05-09 (×3): qty 0.4

## 2011-05-09 MED ORDER — METHOCARBAMOL 100 MG/ML IJ SOLN
500.0000 mg | Freq: Four times a day (QID) | INTRAVENOUS | Status: DC | PRN
  Administered 2011-05-09: 500 mg via INTRAVENOUS
  Filled 2011-05-09: qty 5

## 2011-05-09 MED ORDER — EPHEDRINE SULFATE 50 MG/ML IJ SOLN
INTRAMUSCULAR | Status: DC | PRN
  Administered 2011-05-09: 10 mg via INTRAVENOUS
  Administered 2011-05-09: 5 mg via INTRAVENOUS

## 2011-05-09 MED ORDER — SODIUM CHLORIDE 0.9 % IR SOLN
Status: DC | PRN
  Administered 2011-05-09: 1000 mL

## 2011-05-09 MED ORDER — DOCUSATE SODIUM 100 MG PO CAPS
100.0000 mg | ORAL_CAPSULE | Freq: Two times a day (BID) | ORAL | Status: DC
  Administered 2011-05-09 – 2011-05-13 (×8): 100 mg via ORAL
  Filled 2011-05-09 (×9): qty 1

## 2011-05-09 MED ORDER — ROCURONIUM BROMIDE 100 MG/10ML IV SOLN
INTRAVENOUS | Status: DC | PRN
  Administered 2011-05-09: 1 mg via INTRAVENOUS
  Administered 2011-05-09: 10 mg via INTRAVENOUS
  Administered 2011-05-09: 30 mg via INTRAVENOUS

## 2011-05-09 MED ORDER — LIDOCAINE HCL (CARDIAC) 20 MG/ML IV SOLN
INTRAVENOUS | Status: DC | PRN
  Administered 2011-05-09: 80 mg via INTRAVENOUS

## 2011-05-09 MED ORDER — ONDANSETRON HCL 4 MG/2ML IJ SOLN
INTRAMUSCULAR | Status: DC | PRN
  Administered 2011-05-09: 4 mg via INTRAVENOUS

## 2011-05-09 MED ORDER — METOCLOPRAMIDE HCL 10 MG PO TABS: 5.0000 mg | ORAL_TABLET | Freq: Three times a day (TID) | ORAL | Status: DC | PRN

## 2011-05-09 MED ORDER — WARFARIN SODIUM 5 MG PO TABS
5.0000 mg | ORAL_TABLET | Freq: Once | ORAL | Status: AC
  Administered 2011-05-09: 5 mg via ORAL
  Filled 2011-05-09: qty 1

## 2011-05-09 MED ORDER — POLYETHYLENE GLYCOL 3350 17 G PO PACK
17.0000 g | PACK | Freq: Every day | ORAL | Status: DC
  Administered 2011-05-09 – 2011-05-13 (×5): 17 g via ORAL
  Filled 2011-05-09 (×5): qty 1

## 2011-05-09 MED ORDER — LEVALBUTEROL HCL 1.25 MG/0.5ML IN NEBU
1.2500 mg | INHALATION_SOLUTION | Freq: Once | RESPIRATORY_TRACT | Status: AC
  Administered 2011-05-09: 1.25 mg via RESPIRATORY_TRACT
  Filled 2011-05-09: qty 0.5

## 2011-05-09 MED ORDER — CEFAZOLIN SODIUM 1-5 GM-% IV SOLN
1.0000 g | Freq: Three times a day (TID) | INTRAVENOUS | Status: AC
  Administered 2011-05-09 – 2011-05-10 (×3): 1 g via INTRAVENOUS
  Filled 2011-05-09 (×3): qty 50

## 2011-05-09 MED ORDER — METOCLOPRAMIDE HCL 5 MG/ML IJ SOLN
5.0000 mg | Freq: Three times a day (TID) | INTRAMUSCULAR | Status: DC | PRN
Start: 2011-05-09 — End: 2011-05-13

## 2011-05-09 MED ORDER — DEXAMETHASONE SODIUM PHOSPHATE 4 MG/ML IJ SOLN: 8.0000 mg | Freq: Once | INTRAMUSCULAR | Status: DC | PRN

## 2011-05-09 MED ORDER — MEPERIDINE HCL 50 MG/ML IJ SOLN: 6.2500 mg | INTRAMUSCULAR | Status: DC | PRN

## 2011-05-09 MED ORDER — CEFAZOLIN SODIUM 1-5 GM-% IV SOLN
INTRAVENOUS | Status: DC | PRN
  Administered 2011-05-09: 2 g via INTRAVENOUS

## 2011-05-09 MED ORDER — NEOSTIGMINE METHYLSULFATE 1 MG/ML IJ SOLN
INTRAMUSCULAR | Status: DC | PRN
  Administered 2011-05-09: 4 mg via INTRAVENOUS

## 2011-05-09 MED ORDER — TRAMADOL HCL 50 MG PO TABS
50.0000 mg | ORAL_TABLET | Freq: Four times a day (QID) | ORAL | Status: DC | PRN
  Administered 2011-05-10 – 2011-05-12 (×3): 50 mg via ORAL
  Filled 2011-05-09 (×3): qty 1

## 2011-05-09 MED ORDER — LACTATED RINGERS IV SOLN
INTRAVENOUS | Status: DC | PRN
  Administered 2011-05-09: 13:00:00 via INTRAVENOUS

## 2011-05-09 SURGICAL SUPPLY — 59 items
BLADE EXTENDED COATED 6.5IN (ELECTRODE) ×2 IMPLANT
BLADE HEX COATED 2.75 (ELECTRODE) ×2 IMPLANT
BLADE SAW SAG 73X25 THK (BLADE) ×1
BLADE SAW SGTL 73X25 THK (BLADE) ×1 IMPLANT
BRUSH FEMORAL CANAL (MISCELLANEOUS) IMPLANT
CANISTER SUCTION 2500CC (MISCELLANEOUS) ×2 IMPLANT
CLOTH BEACON ORANGE TIMEOUT ST (SAFETY) ×2 IMPLANT
DRAPE INCISE IOBAN 66X45 STRL (DRAPES) ×3 IMPLANT
DRAPE INCISE IOBAN 85X60 (DRAPES) ×2 IMPLANT
DRAPE ORTHO SPLIT 77X108 STRL (DRAPES) ×4
DRAPE POUCH INSTRU U-SHP 10X18 (DRAPES) ×2 IMPLANT
DRAPE SURG ORHT 6 SPLT 77X108 (DRAPES) ×2 IMPLANT
DRAPE U-SHAPE 47X51 STRL (DRAPES) ×2 IMPLANT
DRAPE WARM FLUID 44X44 (DRAPE) ×1 IMPLANT
DRSG MEPILEX BORDER 4X12 (GAUZE/BANDAGES/DRESSINGS) ×1 IMPLANT
DRSG PAD ABDOMINAL 8X10 ST (GAUZE/BANDAGES/DRESSINGS) IMPLANT
DURAPREP 26ML APPLICATOR (WOUND CARE) ×2 IMPLANT
ELECT REM PT RETURN 9FT ADLT (ELECTROSURGICAL) ×2
ELECTRODE REM PT RTRN 9FT ADLT (ELECTROSURGICAL) ×1 IMPLANT
EVACUATOR 1/8 PVC DRAIN (DRAIN) IMPLANT
EVACUATOR SILICONE 100CC (DRAIN) IMPLANT
FACESHIELD LNG OPTICON STERILE (SAFETY) ×5 IMPLANT
GAUZE XEROFORM 5X9 LF (GAUZE/BANDAGES/DRESSINGS) ×1 IMPLANT
GLOVE BIO SURGEON STRL SZ8 (GLOVE) ×4 IMPLANT
GLOVE ECLIPSE 8.5 STRL (GLOVE) ×2 IMPLANT
HANDPIECE INTERPULSE COAX TIP (DISPOSABLE)
IMMOBILIZER KNEE 20 (SOFTGOODS) ×2
IMMOBILIZER KNEE 20 THIGH 36 (SOFTGOODS) ×1 IMPLANT
KIT BASIN OR (CUSTOM PROCEDURE TRAY) ×2 IMPLANT
MANIFOLD NEPTUNE II (INSTRUMENTS) ×2 IMPLANT
NDL MAYO 6 CRC TAPER PT (NEEDLE) IMPLANT
NEEDLE HYPO 22GX1.5 SAFETY (NEEDLE) IMPLANT
NEEDLE MAYO 6 CRC TAPER PT (NEEDLE) ×2 IMPLANT
NS IRRIG 1000ML POUR BTL (IV SOLUTION) ×3 IMPLANT
PACK TOTAL JOINT (CUSTOM PROCEDURE TRAY) ×2 IMPLANT
PASSER SUT SWANSON 36MM LOOP (INSTRUMENTS) ×1 IMPLANT
PENCIL BUTTON HOLSTER BLD 10FT (ELECTRODE) ×2 IMPLANT
PILLOW ABDUCTION HIP (SOFTGOODS) ×2 IMPLANT
POSITIONER SURGICAL ARM (MISCELLANEOUS) ×2 IMPLANT
SET HNDPC FAN SPRY TIP SCT (DISPOSABLE) IMPLANT
SPONGE GAUZE 4X4 12PLY (GAUZE/BANDAGES/DRESSINGS) ×4 IMPLANT
SPONGE LAP 18X18 X RAY DECT (DISPOSABLE) IMPLANT
STAPLER VISISTAT 35W (STAPLE) ×2 IMPLANT
SUCTION FRAZIER 12FR DISP (SUCTIONS) ×2 IMPLANT
SUT ETHIBOND NAB CT1 #1 30IN (SUTURE) ×4 IMPLANT
SUT ETHILON 3 0 PS 1 (SUTURE) ×2 IMPLANT
SUT FIBERWIRE #2 38 T-5 BLUE (SUTURE) ×2
SUT VIC AB 0 CT1 36 (SUTURE) ×1 IMPLANT
SUT VIC AB 1 CT1 27 (SUTURE) ×8
SUT VIC AB 1 CT1 27XBRD ANTBC (SUTURE) ×2 IMPLANT
SUT VIC AB 2-0 CT1 27 (SUTURE) ×2
SUT VIC AB 2-0 CT1 TAPERPNT 27 (SUTURE) IMPLANT
SUT VIC AB 2-0 CT2 27 (SUTURE) ×4 IMPLANT
SUTURE FIBERWR #2 38 T-5 BLUE (SUTURE) IMPLANT
SYR 30ML LL (SYRINGE) IMPLANT
TOWER CARTRIDGE SMART MIX (DISPOSABLE) IMPLANT
TRAY FOLEY CATH 14FRSI W/METER (CATHETERS) ×1 IMPLANT
WATER STERILE IRR 1500ML POUR (IV SOLUTION) ×1 IMPLANT
YANKAUER SUCT BULB TIP NO VENT (SUCTIONS) ×1 IMPLANT

## 2011-05-09 NOTE — Progress Notes (Signed)
Subjective: Pain controlled, comfortable, son at the bedside no new issues  Objective: Weight change:   Intake/Output Summary (Last 24 hours) at 05/09/11 1411 Last data filed at 05/09/11 1320  Gross per 24 hour  Intake   1300 ml  Output   1150 ml  Net    150 ml   Blood pressure 128/79, pulse 95, temperature 98 F (36.7 C), temperature source Oral, resp. rate 18, height 5\' 6"  (1.676 m), weight 83.8 kg (184 lb 11.9 oz), SpO2 96.00%.  Physical Exam: General: awake, not in any acute distress. HEENT: anicteric sclera, pupils reactive to light and accommodation, EOMI CVS: S1-S2 clear, no murmur rubs or gallops Chest: clear to auscultation bilaterally, no wheezing, rales or rhonchi Abdomen: soft nontender, nondistended, normal bowel sounds, no organomegaly Extremities: no cyanosis, clubbing or edema noted bilaterally Neuro: Nonfocal  Lab Results:  Basename 05/09/11 0530 05/08/11 1438  WBC 11.8* 18.0*  HGB 12.2 12.7  HCT 40.5 40.5  PLT 194 213   BMET  Basename 05/09/11 0530 05/08/11 1438  NA 139 139  K 4.1 4.8  CL 104 103  CO2 26 26  GLUCOSE 114* 124*  BUN 19 22  CREATININE 1.04 1.07  CALCIUM 9.5 10.0    Micro Results: No results found for this or any previous visit (from the past 240 hour(s)).  Studies/Results: Dg Chest 1 View  05/08/2011  *RADIOLOGY REPORT*  Clinical Data: Fall, right hip deformity  CHEST - 1 VIEW  Comparison: 10/11/2009  Findings: Chronic interstitial markings.  Left basilar atelectasis / scarring. No pleural effusion or pneumothorax.  The heart is top normal in size.  Prior lower thoracolumbar vertebral augmentation.  IMPRESSION: No evidence of acute cardiopulmonary disease.  Original Report Authenticated By: Charline Bills, M.D.   Dg Hip Complete Right  05/08/2011  *RADIOLOGY REPORT*  Clinical Data: Fall, right hip deformity  RIGHT HIP - COMPLETE 2+ VIEW  Comparison: None.  Findings: Mildly comminuted right femoral neck fracture with  foreshortening/varus deformity.  No additional fractures are seen.  Prior vertebral augmentation of the lower lumbar spine.  IMPRESSION: Comminuted right femoral neck fracture with foreshortening/varus deformity.  Original Report Authenticated By: Charline Bills, M.D.   Dg Knee 1-2 Views Right  05/08/2011  *RADIOLOGY REPORT*  Clinical Data: Fall  RIGHT KNEE - 1-2 VIEW  Comparison: None.  Findings: No fracture or dislocation is seen.  Tricompartmental degenerative changes.  The visualized soft tissues are unremarkable.  No suprapatellar knee joint effusion.  IMPRESSION: No fracture or dislocation is seen.  Tricompartmental degenerative changes.  Original Report Authenticated By: Charline Bills, M.D.    Medications: Scheduled Meds:   . carbidopa-levodopa  1 tablet Oral TID  . cefTRIAXone (ROCEPHIN) IV  1 g Intravenous Q24H  . cholecalciferol  400 Units Oral BID  . fentaNYL  50 mcg Intravenous Once  . fentaNYL  50 mcg Intravenous Once  . ferrous gluconate  324 mg Oral BID  . levothyroxine  100 mcg Oral QAM  . loratadine  10 mg Oral QPM  . pantoprazole  40 mg Oral Q1200  . phytonadione (VITAMIN K) IV  5 mg Intravenous Once  . predniSONE  2.5 mg Oral QPM  . senna-docusate  1 tablet Oral QHS  . sertraline  25 mg Oral QPM  . zolpidem  2.5 mg Oral QHS  . DISCONTD: sodium chloride   Intravenous STAT  . DISCONTD: sennosides-docusate sodium  1 tablet Oral QHS   Continuous Infusions:   . sodium chloride Stopped (05/09/11 1320)  .  DISCONTD: sodium chloride 125 mL/hr at 05/08/11 1450  . DISCONTD: sodium chloride     PRN Meds:.acetaminophen, acetaminophen, acetaminophen, albuterol, albuterol, alum & mag hydroxide-simeth, guaiFENesin-dextromethorphan, HYDROcodone-acetaminophen, HYDROcodone-acetaminophen, HYDROmorphone, ipratropium, ondansetron (ZOFRAN) IV, ondansetron, senna-docusate, sodium chloride  Assessment/Plan:  Hip fracture, right/ fall:  -INR 1.44, awaiting orthopedic surgery  today, restart Coumadin after the surgery  -) COPD: Currently stable, will continue albuterol and Atrovent nebulizers   -) Gastroesophageal reflux: Will continue proton pump inhibitor   -) History of Parkinson's disease will continue Sinemet   -) History of pulmonary embolism: On Coumadin, but held for surgery, will restart after the right hip fracture repair today  -) question of dysphagia: Patient states that she eats regular diet, will monitor closely   -) Hypothyroidism: continue Synthroid   -) UTI: Pending Urine culture, on Rocephin   DVT prophylaxis: TED hoses for now, will restart Coumadin after the surgery   CODE STATUS DO NOT RESUSCITATE   LOS: 1 day   RAI,RIPUDEEP 05/09/2011, 2:11 PM

## 2011-05-09 NOTE — Anesthesia Preprocedure Evaluation (Addendum)
Anesthesia Evaluation  Patient identified by MRN, date of birth, ID band Patient awake and Patient confused    Reviewed: Allergy & Precautions, H&P , NPO status , Patient's Chart, lab work & pertinent test results  Airway Mallampati: II TM Distance: >3 FB Neck ROM: Full    Dental No notable dental hx. (+) Teeth Intact   Pulmonary neg pulmonary ROS, COPD clear to auscultation  Pulmonary exam normal + decreased breath sounds      Cardiovascular hypertension, Pt. on medications neg cardio ROS - CHF (no evidence of chf) Regular Normal    Neuro/Psych PSYCHIATRIC DISORDERS  Neuromuscular disease Negative Neurological ROS  Negative Psych ROS   GI/Hepatic negative GI ROS, Neg liver ROS, GERD-  ,  Endo/Other  Negative Endocrine ROSHypothyroidism   Renal/GU negative Renal ROS  Genitourinary negative   Musculoskeletal negative musculoskeletal ROS (+)   Abdominal   Peds negative pediatric ROS (+)  Hematology negative hematology ROS (+)   Anesthesia Other Findings Trace edema of bil LE  Reproductive/Obstetrics negative OB ROS                          Anesthesia Physical Anesthesia Plan  ASA: III  Anesthesia Plan: General   Post-op Pain Management:    Induction: Intravenous  Airway Management Planned: Oral ETT  Additional Equipment:   Intra-op Plan:   Post-operative Plan: Extubation in OR  Informed Consent: I have reviewed the patients History and Physical, chart, labs and discussed the procedure including the risks, benefits and alternatives for the proposed anesthesia with the patient or authorized representative who has indicated his/her understanding and acceptance.   Dental advisory given  Plan Discussed with: CRNA  Anesthesia Plan Comments:         Anesthesia Quick Evaluation

## 2011-05-09 NOTE — Op Note (Signed)
Stacey Mccarthy, Stacey Mccarthy            ACCOUNT NO.:  [id number]  MEDICAL RECORD NO.:  [id number]  LOCATION:  1406                         FACILITY:  [facility]  PHYSICIAN:  Doralee Albino. Carola Frost, M.D. DATE OF BIRTH:  July 05, 1915  DATE OF PROCEDURE:  05/09/2011 DATE OF DISCHARGE:                              OPERATIVE REPORT   PREOPERATIVE DIAGNOSIS:  Right displaced femoral neck fracture.  POSTOPERATIVE DIAGNOSIS:  Right displaced femoral neck fracture.  PROCEDURE:  Right hip hemiarthroplasty using a Summit DePuy basic #5 femoral stem, standard neck, 46 mm head.  SURGEON:  Doralee Albino. Carola Frost, MD  ASSISTANT:  Mearl Latin, PA  ANESTHESIA:  General.  COMPLICATIONS:  None.  ESTIMATED BLOOD LOSS:  300 mL.  DISPOSITION:  To PACU.  CONDITION:  Stable.  BRIEF SUMMARY AND INDICATION FOR PROCEDURE:  Stacey Mccarthy is a very pleasant 77 year old female who sustained 3 falls on the day of admission.  X-rays demonstrated a fracture of the femoral neck which was significantly displaced.  I discussed with Stacey and Stacey Mccarthy the risk of nonsurgical versus surgical management and I did elect to proceed with surgery understanding those risks to include heart attack, stroke, infection, nerve injury, vessel injury, hip instability, leg length inequality, and multiple others.  BRIEF SUMMARY OF PROCEDURE:  The patient was administered 2 grams of Ancef, taken to the operating room and general anesthesia was induced. The patient was positioned on the right side up very carefully making certain not to generate any skin tears of Stacey fragile skin.  All prominences were padded appropriately including auxiliary roll.  After standard prep and drape, a 12 cm incision was then made center of the proximal trochanter.  Dissection was carried down to the IT band and IT band was split in line with the incision and the tissues retracted.  The bursa was incised and the proximal posterior edge of the greater trochanter  developed.  The medius retracted anteriorly.  Piriformis identified, tagged, and divided.  Similarly short rotators were identified, tagged, and divided from the underlying capsule.  The capsule was tied back to the acetabulum and along the neck.  A #2 FiberWire using tendon grabbing.  A stitch was placed into the piriformis and short rotators and then #1 Vicryl in one of the edges of the capsule to allow to be reflected.  The cutting guide was placed along the proximal femur.  The neck cut was made.  Fractured bone removed and then the head removed and sized to a 46.  This was trialed with both the 46 and 47 finding best fit with the 46.  Some of the ligamentum was removed with Bovie.  The femur was then prepared with sequential reaming and then sequential broaching achieving excellent fit and stability with the #5 stem. It was trialed with a +5 neck and found to be slightly too long with some resistance to extension going back to standard length provided for outstanding and easy range of motion without __________ and outstanding stability in adduction to 20 with flexion to 90 and internal rotation past 50 with no subluxation. Similarly external rotation and extension was stable.  Trial components were removed.  Actual components placed using standard neck  and the previously discussed implants.  This again appeared to restore appropriate limb length while maintaining stability and range of motion. It was irrigated and then the capsule repaired with #1 Vicryl figure-of- eight, #2 FiberWire for the tendons directly back through bone and then #1 for the tensors, 0 for the deep subcu, 2-0 for the shallow subcu and staples for the skin.  Sterile gently compressive dressing was applied and then abduction pillow.  The patient awakened from anesthesia and transported to the PACU in stable condition.  Stacey Morita, PA-C assisted me throughout this procedure and was absolutely necessary for safe  and effective completion of the case.  As he was required to maintain exposure which was quite difficult and required additional time because of the patient's morbid obesity as well as to reduce and dislocate the trial and actual components while avoiding fracture or other soft tissue injury.  He also assisted me in simultaneous wound closure.  PROGNOSIS:  Stacey Mccarthy will be weightbearing as tolerated with posterior hip precautions, however, I do feel that the additional inversion provided to Stacey will help to medicate against posterior instability. I do anticipate pharmacologic DVT prophylaxis while in the hospital and transitioning back to Stacey home regimen.  Because of Stacey age and multiple comorbidities, she is clearly at increased risk of both early complications.     Doralee Albino. Carola Frost, M.D.     MHH/MEDQ  D:  05/09/2011  T:  05/09/2011  Job:  [id number]

## 2011-05-09 NOTE — Plan of Care (Signed)
Problem: Phase I Progression Outcomes Goal: Pre op pain controlled with appropriate interventions Pt will be free of pain 24hrs post-procedure. Goal: Post op clear liquids, advance diet as tolerated Pt kept NPO for procedure in am.

## 2011-05-09 NOTE — Progress Notes (Signed)
Received PT orders.  Pt scheduled for surgery today therefore will be holding off.  Please clarify PT orders post-op.  Thank you.

## 2011-05-09 NOTE — Progress Notes (Signed)
ANTICOAGULATION CONSULT NOTE - Initial Consult  Pharmacy Consult for Warfarin  Indication: Hx of PE, now s/p R hip hemi-arthroplasty  Allergies  Allergen Reactions  . Banana Other (See Comments)    Reaction unknown  . Cinobac (Cinoxacin) Other (See Comments)    Reaction unknown   . Diflucan Other (See Comments)    Reaction unknown   . Doxycycline Hyclate Other (See Comments)    Reaction unknown   . Mold Extract (Trichophyton Mentagrophyte) Other (See Comments)    Reaction unknown   . Sulfa Antibiotics Other (See Comments)    Reaction unknown     Patient Measurements: Height: 5\' 6"  (167.6 cm) Weight: 184 lb 11.9 oz (83.8 kg) IBW/kg (Calculated) : 59.3   Vital Signs: Temp: 97.4 F (36.3 C) (11/11 1900) Temp src: Axillary (11/11 1900) BP: 105/68 mmHg (11/11 1900) Pulse Rate: 101  (11/11 1900)  Labs:  Basename 05/09/11 0530 05/08/11 1438  HGB 12.2 12.7  HCT 40.5 40.5  PLT 194 213  APTT -- 28  LABPROT 17.8* 23.3*  INR 1.44 2.03*  HEPARINUNFRC -- --  CREATININE 1.04 1.07  CKTOTAL -- --  CKMB -- --  TROPONINI -- --   Estimated Creatinine Clearance: 37.7 ml/min (by C-G formula based on Cr of 1.04).  Medical History: Past Medical History  Diagnosis Date  . Arthritis   . Pulmonary embolism 11/2008  . Debility 10/17/09  . Asthma   . Coagulation defect 03/24/11  . Thyroid disease   . Hypoxemia   . Pneumathemia   . Depression   . Parkinson disease   . Sacroiliitis   . Osteoporosis   . Petechiae   . Dysphagia   . Hyperlipemia   . Neuropathy   . Low vision, one eye, not otherwise specified   . Unspecified essential hypertension   . Other pulmonary embolism and infarction   . Atrial fibrillation   . Heart failure   . Phlebitis and thrombophlebitis of other deep vessels of lower extremities   . Allergic rhinitis due to pollen   . COPD (chronic obstructive pulmonary disease)   . CHF (congestive heart failure)   . Reflux esophagitis   . Inguinal hernia  without mention of obstruction or gangrene, unilateral or unspecified, (not specified as recurrent)   . Unspecified constipation   . Osteoarthrosis, unspecified whether generalized or localized, unspecified site   . Pain in joint, lower leg   . Sacroiliitis, not elsewhere classified   . Muscle weakness (generalized)   . Osteoporosis, unspecified   . Pathologic fracture of vertebrae   . Insomnia, unspecified   . Other malaise and fatigue   . Abnormality of gait   . Rash and other nonspecific skin eruption   . Edema   . Shortness of breath   . Dysphagia, oropharyngeal phase   . Unspecified urinary incontinence   . Electrocardiogram finding, abnormal, without diagnosis   . Hypoxemia   . Debility, unspecified   . Closed fracture of dorsal (thoracic) vertebra without mention of spinal cord injury   . Encounter for long-term (current) use of anticoagulants   . Kyphosis 10/13/09    kyphoplasty  . Rib fractures   . Complication of anesthesia     PNA s/p surgeries  . Hypothyroidism   . Blood transfusion   . GERD (gastroesophageal reflux disease)   . Pneumonia     Medications:  Scheduled:    . acetaminophen  1,000 mg Intravenous Q6H  . carbidopa-levodopa  1 tablet Oral TID  .  ceFAZolin (ANCEF) IV  1 g Intravenous Q8H  . cefTRIAXone (ROCEPHIN) IV  1 g Intravenous Q24H  . cholecalciferol  400 Units Oral BID  . docusate sodium  100 mg Oral BID  . enoxaparin  40 mg Subcutaneous Q24H  . ferrous gluconate  324 mg Oral BID  . levalbuterol  1.25 mg Nebulization Once  . levothyroxine  100 mcg Oral QAM  . loratadine  10 mg Oral QPM  . pantoprazole  40 mg Oral Q1200  . polyethylene glycol  17 g Oral Daily  . predniSONE  2.5 mg Oral QPM  . sertraline  25 mg Oral QPM  . DISCONTD: ceFAZolin (ANCEF) IV  1 g Intravenous Once  . DISCONTD: senna-docusate  1 tablet Oral QHS  . DISCONTD: zolpidem  2.5 mg Oral QHS   Infusions:    . sodium chloride Stopped (05/09/11 1320)   PRN: albuterol,  albuterol, alum & mag hydroxide-simeth, guaiFENesin-dextromethorphan, ipratropium, metoCLOPramide (REGLAN) injection, metoCLOPramide, morphine, ondansetron (ZOFRAN) IV, ondansetron, senna-docusate, traMADol, DISCONTD: acetaminophen, DISCONTD: acetaminophen, DISCONTD: acetaminophen, DISCONTD: dexamethasone, DISCONTD: HYDROcodone-acetaminophen, DISCONTD: HYDROcodone-acetaminophen, DISCONTD: HYDROmorphone, DISCONTD: meperidine DISCONTD: sodium chloride  Assessment: 77 yo F on chronic coumadin for hx of PE, now s/p R hip hemi-arthroplasty due to fall. Plan to resume coumadin tonight.  Home dose = warfarin 7mg  MWF and 5mg  TuThSS  Goal of Therapy:  INR 2-3   Plan:  Warfarin 5mg  PO x1 tonight  Annia Belt 05/09/2011,8:59 PM

## 2011-05-09 NOTE — Transfer of Care (Signed)
Immediate Anesthesia Transfer of Care Note  Patient: Stacey Mccarthy  Procedure(s) Performed:  ARTHROPLASTY BIPOLAR HIP - Hemi-arthroplasty (Depuy) right  Patient Location: PACU  Anesthesia Type: General  Level of Consciousness: awake, alert  and patient cooperative  Airway & Oxygen Therapy: Patient Spontanous Breathing and Patient connected to face mask oxygen  Post-op Assessment: Report given to PACU RN and Post -op Vital signs reviewed and stable  Post vital signs: Reviewed and stable  Complications: No apparent anesthesia complications

## 2011-05-09 NOTE — Preoperative (Signed)
Beta Blockers   Reason not to administer Beta Blockers:Not Applicable 

## 2011-05-09 NOTE — Progress Notes (Signed)
CM consulted per MD order for [facility] needs. Pt from SNF. Awaiting hip surgery. Appropriate CSW referral

## 2011-05-09 NOTE — Brief Op Note (Signed)
05/08/2011 - 05/09/2011  4:32 PM  PATIENT:  Stacey Mccarthy  77 y.o. female  PRE-OPERATIVE DIAGNOSIS:  fractured right hip  POST-OPERATIVE DIAGNOSIS:  fractured right hip  PROCEDURE:  Procedure(s): Right hip hemiarthroplasty w DePuy Summit, #5 stem, standard neck, 46mm head  SURGEON:  Surgeon(s): [employer]  PHYSICIAN ASSISTANT:   ASSISTANTS: Montez Morita, PAC  ANESTHESIA:   general  EBL:  Total I/O In: 1400 [I.V.:1400] Out: 750 [Urine:400; Blood:350]  BLOOD ADMINISTERED:none  DRAINS: none   LOCAL MEDICATIONS USED:  NONE  SPECIMEN:  No Specimen  DISPOSITION OF SPECIMEN:  N/A  COUNTS:  YES  TOURNIQUET:  * No tourniquets in log *  DICTATION: .Other Dictation: Dictation Number [id number]  PLAN OF CARE: inpatient  PATIENT DISPOSITION:  PACU - hemodynamically stable.   Delay start of Pharmacological VTE agent (>24hrs) due to surgical blood loss or risk of bleeding:  {YES/NO/NOT APPLICABLE:20182

## 2011-05-09 NOTE — Anesthesia Postprocedure Evaluation (Signed)
  Anesthesia Post-op Note  Patient: Stacey Mccarthy  Procedure(s) Performed:  ARTHROPLASTY BIPOLAR HIP - Hemi-arthroplasty (Depuy) right  Patient Location: PACU  Anesthesia Type: General  Level of Consciousness: awake and alert   Airway and Oxygen Therapy: Patient Spontanous Breathing  Post-op Pain: mild  Post-op Assessment: Post-op Vital signs reviewed, Patient's Cardiovascular Status Stable, Respiratory Function Stable, Patent Airway and No signs of Nausea or vomiting  Post-op Vital Signs: stable  Complications: No apparent anesthesia complications

## 2011-05-10 DIAGNOSIS — N390 Urinary tract infection, site not specified: Secondary | ICD-10-CM | POA: Clinically undetermined

## 2011-05-10 LAB — BASIC METABOLIC PANEL
Calcium: 8.5 mg/dL (ref 8.4–10.5)
Creatinine, Ser: 1.05 mg/dL (ref 0.50–1.10)
GFR calc non Af Amer: 45 mL/min — ABNORMAL LOW (ref >90)
Sodium: 137 mEq/L (ref 135–145)

## 2011-05-10 LAB — CBC
MCH: 26.7 pg (ref 26.0–34.0)
MCHC: 31 g/dL (ref 30.0–36.0)
MCV: 86.2 fL (ref 78.0–100.0)
Platelets: 160 10*3/uL (ref 150–400)

## 2011-05-10 LAB — PROTIME-INR
INR: 1.21 (ref 0.00–1.49)
Prothrombin Time: 15.6 seconds — ABNORMAL HIGH (ref 11.6–15.2)

## 2011-05-10 MED ORDER — POLYETHYLENE GLYCOL 3350 17 G PO PACK
17.0000 g | PACK | Freq: Every day | ORAL | Status: DC | PRN
  Filled 2011-05-10: qty 1

## 2011-05-10 MED ORDER — ENOXAPARIN SODIUM 40 MG/0.4ML ~~LOC~~ SOLN
40.0000 mg | SUBCUTANEOUS | Status: DC
  Administered 2011-05-10 – 2011-05-13 (×4): 40 mg via SUBCUTANEOUS
  Filled 2011-05-10 (×4): qty 0.4

## 2011-05-10 MED ORDER — WARFARIN SODIUM 6 MG PO TABS
7.0000 mg | ORAL_TABLET | Freq: Once | ORAL | Status: AC
  Administered 2011-05-10: 7 mg via ORAL
  Filled 2011-05-10: qty 1

## 2011-05-10 NOTE — Progress Notes (Signed)
Subjective: 1 Day Post-Op Procedure(s) (LRB): ARTHROPLASTY BIPOLAR HIP (Right)   Doing well, sitting in chair Very pleasant, conversing well No complaints  Objective: Current Vitals Blood pressure 106/66, pulse 91, temperature 97.7 F (36.5 C), temperature source Oral, resp. rate 16, height 5\' 6"  (1.676 m), weight 85.2 kg (187 lb 13.3 oz), SpO2 96.00%. Vital signs in last 24 hours: Temp:  [97 F (36.1 C)-98.3 F (36.8 C)] 97.7 F (36.5 C) (11/12 0601) Pulse Rate:  [84-101] 91  (11/12 1050) Resp:  [16-21] 16  (11/12 0601) BP: (105-150)/(57-79) 106/66 mmHg (11/12 1050) SpO2:  [86 %-98 %] 96 % (11/12 1050) Weight:  [85.2 kg (187 lb 13.3 oz)] 187 lb 13.3 oz (85.2 kg) (11/12 0601)  Intake/Output from previous day: 11/11 0701 - 11/12 0700 In: 2650 [I.V.:2650] Out: 800 [Urine:450; Blood:350]  LABS  Basename 05/10/11 0445 05/09/11 0530 05/08/11 1438  HGB 10.1* 12.2 12.7    Basename 05/10/11 0445 05/09/11 0530  WBC 8.4 11.8*  RBC 3.78* 4.69  HCT 32.6* 40.5  PLT 160 194    Basename 05/10/11 0445 05/09/11 0530  NA 137 139  K 4.8 4.1  CL 103 104  CO2 26 26  BUN 16 19  CREATININE 1.05 1.04  GLUCOSE 133* 114*  CALCIUM 8.5 9.5    Basename 05/10/11 0445 05/09/11 0530  LABPT -- --  INR 1.21 1.44     Physical Exam  Gen: NAD, pleasant, sitting in chair  Lungs: clear anterior fields    Cardiac: S1 and S2 Abd: + BS, NT  Ext: R LEX    Dressing clean, dry and intact   Abduction pillow in place   DPN, SPN, TN sensation intact   EHL, FHL, AT, PT, Peroneals, Gastroc motor intact   Ext warm   + DP pulse   No DCT   Compartment soft and non tender Imaging    Post op xrays show stable prosthesis   Assessment/Plan: 1 Day Post-Op Procedure(s) (LRB): ARTHROPLASTY BIPOLAR HIP (Right)  77 y/o female s/p ground level fall  1. R femoral neck fracture s/p R hip hemi  WBAT R LEx  Posterior hip precautions  PT/OT daily   Abduction pillow for comfort  Ice and  elevate  TED  Wound care for skin tears R leg  2. Medical issues   Per medical service 3. DVT/PE prophylaxis  Pt with hx of PE and hx of a-fib, on chronic anticoagulation  Coumadin restarted  Ordered Lovenox bridge after surgery but appears to have been d/c'd d  Pt with RF for development of DVT/PE such as immobility, major ortho surgery, h/o VTE, and obesity  Defer bridging to primary team but would recommend bridge  Continue with TEDs 4. Diet  As tolerated 5. Pain  Minimize narcs  PO meds as tolerated 6. Dispo  Hopeful for transfer back to [facility] at end of week, thurs or Friday   Mearl Latin, PA-C 05/10/2011, 11:05 AM

## 2011-05-10 NOTE — Progress Notes (Signed)
Subjective: "I am surprised at how well my pain is controlled".   Objective: Vital signs Filed Vitals:   05/10/11 0211 05/10/11 0601 05/10/11 1050 05/10/11 1142  BP: 110/60 115/70 106/66 110/58  Pulse: 87 84 91 90  Temp: 98.3 F (36.8 C) 97.7 F (36.5 C)  98.4 F (36.9 C)  TempSrc: Oral Oral  Oral  Resp: 16 16  18   Height:      Weight:  85.2 kg (187 lb 13.3 oz)    SpO2: 96% 98% 96% 95%   Weight change: 1.4 kg (3 lb 1.4 oz) Last BM Date: 05/07/11  Intake/Output from previous day: 11/11 0701 - 11/12 0700 In: 2650 [I.V.:2650] Out: 800 [Urine:450; Blood:350] Total I/O In: 460 [P.O.:460] Out: 400 [Urine:400]   Physical Exam: General: Alert, awake, oriented x3, in no acute distress. HEENT: No bruits, no goiter. Heart: Regular rate and rhythm, without murmurs, rubs, gallops. Lungs: Clear to auscultation bilaterally. Abdomen: Soft, nontender, nondistended, positive bowel sounds. Extremities: No clubbing cyanosis or edema with positive pedal pulses. Neuro: Grossly intact, nonfocal.    Lab Results: Basic Metabolic Panel:  Basename 05/10/11 0445 05/09/11 0530  NA 137 139  K 4.8 4.1  CL 103 104  CO2 26 26  GLUCOSE 133* 114*  BUN 16 19  CREATININE 1.05 1.04  CALCIUM 8.5 9.5  MG -- --  PHOS -- --   Liver Function Tests: No results found for this basename: AST:2,ALT:2,ALKPHOS:2,BILITOT:2,PROT:2,ALBUMIN:2 in the last 72 hours No results found for this basename: LIPASE:2,AMYLASE:2 in the last 72 hours No results found for this basename: AMMONIA:2 in the last 72 hours CBC:  Basename 05/10/11 0445 05/09/11 0530 05/08/11 1438  WBC 8.4 11.8* --  NEUTROABS -- -- 16.0*  HGB 10.1* 12.2 --  HCT 32.6* 40.5 --  MCV 86.2 86.4 --  PLT 160 194 --   Cardiac Enzymes: No results found for this basename: CKTOTAL:3,CKMB:3,CKMBINDEX:3,TROPONINI:3 in the last 72 hours BNP: No results found for this basename: POCBNP:3 in the last 72 hours D-Dimer: No results found for this  basename: DDIMER:2 in the last 72 hours CBG: No results found for this basename: GLUCAP:6 in the last 72 hours Hemoglobin A1C: No results found for this basename: HGBA1C in the last 72 hours Fasting Lipid Panel: No results found for this basename: CHOL,HDL,LDLCALC,TRIG,CHOLHDL,LDLDIRECT in the last 72 hours Thyroid Function Tests:  Basename 05/09/11 0530  TSH 3.803  T4TOTAL --  FREET4 --  T3FREE --  THYROIDAB --   Anemia Panel: No results found for this basename: VITAMINB12,FOLATE,FERRITIN,TIBC,IRON,RETICCTPCT in the last 72 hours Coagulation:  Basename 05/10/11 0445 05/09/11 0530  LABPROT 15.6* 17.8*  INR 1.21 1.44   Urine Drug Screen:  Alcohol Level: No results found for this basename: ETH:2 in the last 72 hours Urinalysis:  Misc. Labs:  Recent Results (from the past 240 hour(s))  URINE CULTURE     Status: Normal (Preliminary result)   Collection Time   05/08/11  3:58 PM      Component Value Range Status Comment   Specimen Description URINE, CLEAN CATCH   Final    Special Requests NONE   Final    Setup Time [id number]   Final    Colony Count >=100,000 COLONIES/ML   Final    Culture GRAM NEGATIVE RODS   Final    Report Status PENDING   Incomplete     Studies/Results: Dg Hip Complete Right  05/09/2011  *RADIOLOGY REPORT*  Clinical Data: Right hip fracture.  RIGHT HIP - COMPLETE 2+  VIEW  Comparison: 05/08/2011  Findings: The patient has undergone a right hip hemiarthroplasty. The prosthesis appears in good position.  No fractures.  IMPRESSION: Satisfactory appearance of the right hip after right hemiarthroplasty.  Original Report Authenticated By: Gwynn Burly, M.D.    Medications: Scheduled Meds:   . acetaminophen  1,000 mg Intravenous Q6H  . carbidopa-levodopa  1 tablet Oral TID  . ceFAZolin (ANCEF) IV  1 g Intravenous Q8H  . cefTRIAXone (ROCEPHIN) IV  1 g Intravenous Q24H  . cholecalciferol  400 Units Oral BID  . docusate sodium  100 mg Oral BID  .  ferrous gluconate  324 mg Oral BID  . levalbuterol  1.25 mg Nebulization Once  . levothyroxine  100 mcg Oral QAM  . loratadine  10 mg Oral QPM  . pantoprazole  40 mg Oral Q1200  . polyethylene glycol  17 g Oral Daily  . predniSONE  2.5 mg Oral QPM  . sertraline  25 mg Oral QPM  . warfarin  5 mg Oral Once  . warfarin  7 mg Oral ONCE-1800  . DISCONTD: ceFAZolin (ANCEF) IV  1 g Intravenous Once  . DISCONTD: enoxaparin  40 mg Subcutaneous Q24H  . DISCONTD: enoxaparin  40 mg Subcutaneous Q24H  . DISCONTD: senna-docusate  1 tablet Oral QHS  . DISCONTD: zolpidem  2.5 mg Oral QHS   Continuous Infusions:   . sodium chloride Stopped (05/09/11 1320)   PRN Meds:.albuterol, albuterol, alum & mag hydroxide-simeth, guaiFENesin-dextromethorphan, ipratropium, methocarbamol(ROBAXIN) IV, methocarbamol, metoCLOPramide (REGLAN) injection, metoCLOPramide, morphine, ondansetron (ZOFRAN) IV, ondansetron, senna-docusate, traMADol, DISCONTD: acetaminophen, DISCONTD: acetaminophen, DISCONTD: acetaminophen, DISCONTD: dexamethasone, DISCONTD: HYDROcodone-acetaminophen, DISCONTD: HYDROcodone-acetaminophen DISCONTD: HYDROmorphone, DISCONTD: meperidine, DISCONTD: sodium chloride  Assessment/Plan:  Principal Problem: 1 *Hip fracture, right: per ortho. S/P arthroplasty r hip 11/11.  Active Problems:  2.Encounter for long-term (current) use of anticoagulants: per pharmacy for now  3.PE (pulmonary embolism): restart coumadin per pharmacy. Will need to determine risk/benefit  4.Hypothyroidism: cont synthroid 5. COPD (chronic obstructive pulmonary disease) stable at baseline. Cont nebs 6. Dysphagia: no s/sx swallowing difficulty 7. Parkinson's disease stable 8. Fall: See #1. PT/OT 9. UTI: gm neg rods. Continue rocephin    LOS: 2 days   [facility] M 05/10/2011, 3:38 PM  I have seen and examined the patient. Agree with the note and plan as outlined by PA, Toya Smothers . Will change to regular diet per patient's  request. Add bowel regimen. Add lovenox until INR therapeutic. Family (daughter) requested to continue coumadin despite the fall and right hip fracture. She states that the patient usually ends up with DVT/PE if coumadin is discontinued.    Davie Claud 05/10/2011 5:14 PM

## 2011-05-10 NOTE — Consult Note (Signed)
ANTICOAGULATION CONSULT NOTE - Follow Up Consult  Pharmacy Consult for lovenox Indication: VTE prophylaxis  Allergies  Allergen Reactions  . Banana Other (See Comments)    Reaction unknown  . Cinobac (Cinoxacin) Other (See Comments)    Reaction unknown   . Diflucan Other (See Comments)    Reaction unknown   . Doxycycline Hyclate Other (See Comments)    Reaction unknown   . Mold Extract (Trichophyton Mentagrophyte) Other (See Comments)    Reaction unknown   . Sulfa Antibiotics Other (See Comments)    Reaction unknown     Patient Measurements: Height: 5\' 6"  (167.6 cm) Weight: 187 lb 13.3 oz (85.2 kg) IBW/kg (Calculated) : 59.3  Adjusted Body Weight:   Vital Signs: Temp: 97.9 F (36.6 C) (11/12 1500) Temp src: Oral (11/12 1500) BP: 110/60 mmHg (11/12 1500) Pulse Rate: 98  (11/12 1500)  Labs:  Basename 05/10/11 0445 05/09/11 0530 05/08/11 1438  HGB 10.1* 12.2 --  HCT 32.6* 40.5 40.5  PLT 160 194 213  APTT -- -- 28  LABPROT 15.6* 17.8* 23.3*  INR 1.21 1.44 2.03*  HEPARINUNFRC -- -- --  CREATININE 1.05 1.04 1.07  CKTOTAL -- -- --  CKMB -- -- --  TROPONINI -- -- --   Estimated Creatinine Clearance: 37.6 ml/min (by C-G formula based on Cr of 1.05).   Medications:    Assessment: Pt is already being followed for warfarin.  Spoke with [facility] MD and Ortho PA about dosing desired.  Ortho PA clarified for lovenox 40mg  sq q24hr Goal of Therapy:  Lovenox based on weight/renal function   Plan:  Lovenox 40mg  sq q24hr  Darlina Guys, Jacquenette Shone Crowford 05/10/2011,5:50 PM

## 2011-05-10 NOTE — Progress Notes (Signed)
Physical Therapy Evaluation Patient Details Name: Stacey Mccarthy MRN: [id number] DOB: Jan 22, 1916 Today's Date: 05/10/2011 Time: 409-811   Eval II Problem List:  Patient Active Problem List  Diagnoses  . Osteoporosis, unspecified  . Debility, unspecified  . Encounter for long-term (current) use of anticoagulants  . PE (pulmonary embolism)  . Hypothyroidism  . COPD (chronic obstructive pulmonary disease)  . Dysphagia  . Parkinson's disease  . Fall  . Hip fracture, right    Past Medical History:  Past Medical History  Diagnosis Date  . Arthritis   . Pulmonary embolism 11/2008  . Debility 10/17/09  . Asthma   . Coagulation defect 03/24/11  . Thyroid disease   . Hypoxemia   . Pneumathemia   . Depression   . Parkinson disease   . Sacroiliitis   . Osteoporosis   . Petechiae   . Dysphagia   . Hyperlipemia   . Neuropathy   . Low vision, one eye, not otherwise specified   . Unspecified essential hypertension   . Other pulmonary embolism and infarction   . Atrial fibrillation   . Heart failure   . Phlebitis and thrombophlebitis of other deep vessels of lower extremities   . Allergic rhinitis due to pollen   . COPD (chronic obstructive pulmonary disease)   . CHF (congestive heart failure)   . Reflux esophagitis   . Inguinal hernia without mention of obstruction or gangrene, unilateral or unspecified, (not specified as recurrent)   . Unspecified constipation   . Osteoarthrosis, unspecified whether generalized or localized, unspecified site   . Pain in joint, lower leg   . Sacroiliitis, not elsewhere classified   . Muscle weakness (generalized)   . Osteoporosis, unspecified   . Pathologic fracture of vertebrae   . Insomnia, unspecified   . Other malaise and fatigue   . Abnormality of gait   . Rash and other nonspecific skin eruption   . Edema   . Shortness of breath   . Dysphagia, oropharyngeal phase   . Unspecified urinary incontinence   . Electrocardiogram  finding, abnormal, without diagnosis   . Hypoxemia   . Debility, unspecified   . Closed fracture of dorsal (thoracic) vertebra without mention of spinal cord injury   . Encounter for long-term (current) use of anticoagulants   . Kyphosis 10/13/09    kyphoplasty  . Rib fractures   . Complication of anesthesia     PNA s/p surgeries  . Hypothyroidism   . Blood transfusion   . GERD (gastroesophageal reflux disease)   . Pneumonia    Past Surgical History:  Past Surgical History  Procedure Date  . Tonsillectomy and adenoidectomy   . Breast surgery 1970    Left Benign Breast Bx  . Cataract extraction, bilateral 1992/1995    IOL  . Pressure ulcer debridement     statis ulcer, right leg  . Bladder suspension 2001    AP  . Retinal tear repair cryotherapy   . Plantar fascia surgery   . Nasal sinus surgery   . Appendectomy 2004  . Back surgery     PT Assessment/Plan/Recommendation PT Assessment Clinical Impression Statement: Pt presents with diagnosis of R comminuted femoral neck fracture, s/p R hemiarthroplasty. Pt will benefit from skilled PT in the acute care setting to improve strength, activity tolerance, transfers, and gait in preparation for DC to SNF for ST rehab. PT Recommendation/Assessment: Patient will need skilled PT in the acute care venue PT Problem List: Decreased strength;Decreased range of motion;Decreased activity  tolerance;Decreased balance;Decreased mobility;Decreased knowledge of use of DME;Decreased knowledge of precautions;Pain PT Therapy Diagnosis : Difficulty walking;Abnormality of gait;Generalized weakness;Acute pain PT Plan PT Frequency: Min 3X/week PT Treatment/Interventions: DME instruction;Gait training;Functional mobility training;Therapeutic exercise;Patient/family education PT Recommendation Recommendations for Other Services: OT consult Follow Up Recommendations: Skilled nursing facility Equipment Recommended: None recommended by PT PT Goals    Acute Rehab PT Goals PT Goal Formulation: With patient Time For Goal Achievement: 2 weeks Pt will go Supine/Side to Sit: with mod assist;with cues (comment type and amount) Pt will go Sit to Supine/Side: with mod assist;with cues (comment type and amount) Pt will Transfer Sit to Stand/Stand to Sit: with mod assist;from elevated surface;with upper extremity assist Pt will Transfer Bed to Chair/Chair to Bed: with mod assist;with cues (comment type and amount) Pt will Ambulate: 1 - 15 feet;with mod assist;with rolling walker Pt will Perform Home Exercise Program: with min assist  PT Evaluation Precautions/Restrictions  Precautions Precautions: Fall;Posterior Hip Precaution Booklet Issued: No Precaution Comments: Informed RN/tech of precautions. Wrote precautions and WB status on dry-erase board. Also informed patient during session. Restrictions Weight Bearing Restrictions: Yes RLE Weight Bearing: Weight bearing as tolerated Prior Functioning  Home Living Lives With: Other (Comment) (at [employer]. Has aides that assist PRN.) Receives Help From: Other (Comment) (Aides.) Home Adaptive Equipment: Walker - Proofreader;Wheelchair - powered;Other (comment) (unsure if pt has scooter or power wheelchair) Prior Function Level of Independence: Needs assistance with gait;Needs assistance with tranfers;Needs assistance with ADLs Cognition Cognition Arousal/Alertness: Awake/alert Overall Cognitive Status: Appears within functional limits for tasks assessed Orientation Level: Oriented X4 Sensation/Coordination   Extremity Assessment RLE Assessment RLE Assessment: Not tested RLE Strength RLE Overall Strength Comments: Assist to mobilize R LE. LLE Assessment LLE Assessment: Not tested (Assist to mobilize L LE) Mobility (including Balance) Bed Mobility Bed Mobility: Yes Supine to Sit: 1: +2 Total assist;HOB elevated (Comment degrees) ((Pt=15%)) Supine to Sit Details  (indicate cue type and reason): VCs safety, technique. Assist for trunk and LEs off EOB. Increased time. Utilized bed pad for scooting. Sitting - Scoot to Edge of Bed: 1: +2 Total assist ((Pt=5%)) Sitting - Scoot to Edge of Bed Details (indicate cue type and reason): Vcs for safety, technique. Pt unable to weight-shift. Utilized bed pad for scooting. Transfers Transfers: Yes Sit to Stand: 1: +2 Total assist ((Pt=25%)) Sit to Stand Details (indicate cue type and reason): x2. VCs safety, technique. Assist to rise, stabilize. Pt only able to stand for about 10 seconds before needing to sit.  Stand to Sit: 1: +2 Total assist ((Pt=25%)) Stand to Sit Details: x2. VCs safety, technique. Assist to control descent.  Squat Pivot Transfers: 1: +2 Total assist ((Pt=25%)) Squat Pivot Transfer Details (indicate cue type and reason): VCs safety, technique. Pt unable to weight-bear enough on R LE to allow for stepping with L LE. Squat-pivot to left side to recliner. Pt leans moderately to left side. Ambulation/Gait Ambulation/Gait: No    Exercise    End of Session PT - End of Session Equipment Utilized During Treatment: Gait belt Activity Tolerance: Patient limited by pain;Patient limited by fatigue Patient left: in chair;with call bell in reach;with family/visitor present Nurse Communication: Need for lift equipment;Weight bearing status, Hip precautions General Behavior During Session: [facility] for tasks performed Cognition: [facility] for tasks performed  Rebeca [facility] 05/10/2011, 11:18 AM

## 2011-05-10 NOTE — Progress Notes (Signed)
See shadow chart for yellow Clinical Social Work assessment & placement note.   Pt admitted from [facility], noted PT recommending ST SNF, CSW confirmed with [facility] @ [facility] that they will have a bed available for pt @ their SNF when medically ready to be discharged. CSW will follow-up.

## 2011-05-10 NOTE — Progress Notes (Signed)
ANTICOAGULATION CONSULT NOTE - Initial Consult  Pharmacy Consult for Warfarin  Indication: Hx of PE, now s/p R hip hemi-arthroplasty  Allergies  Allergen Reactions  . Banana Other (See Comments)    Reaction unknown  . Cinobac (Cinoxacin) Other (See Comments)    Reaction unknown   . Diflucan Other (See Comments)    Reaction unknown   . Doxycycline Hyclate Other (See Comments)    Reaction unknown   . Mold Extract (Trichophyton Mentagrophyte) Other (See Comments)    Reaction unknown   . Sulfa Antibiotics Other (See Comments)    Reaction unknown     Patient Measurements: Height: 5\' 6"  (167.6 cm) Weight: 187 lb 13.3 oz (85.2 kg) IBW/kg (Calculated) : 59.3   Vital Signs: Temp: 97.7 F (36.5 C) (11/12 0601) Temp src: Oral (11/12 0601) BP: 115/70 mmHg (11/12 0601) Pulse Rate: 84  (11/12 0601)  Labs:  Basename 05/10/11 0445 05/09/11 0530 05/08/11 1438  HGB 10.1* 12.2 --  HCT 32.6* 40.5 40.5  PLT 160 194 213  APTT -- -- 28  LABPROT 15.6* 17.8* 23.3*  INR 1.21 1.44 2.03*  HEPARINUNFRC -- -- --  CREATININE 1.05 1.04 1.07  CKTOTAL -- -- --  CKMB -- -- --  TROPONINI -- -- --   Estimated Creatinine Clearance: 37.6 ml/min (by C-G formula based on Cr of 1.05).  Medical History: Past Medical History  Diagnosis Date  . Arthritis   . Pulmonary embolism 11/2008  . Debility 10/17/09  . Asthma   . Coagulation defect 03/24/11  . Thyroid disease   . Hypoxemia   . Pneumathemia   . Depression   . Parkinson disease   . Sacroiliitis   . Osteoporosis   . Petechiae   . Dysphagia   . Hyperlipemia   . Neuropathy   . Low vision, one eye, not otherwise specified   . Unspecified essential hypertension   . Other pulmonary embolism and infarction   . Atrial fibrillation   . Heart failure   . Phlebitis and thrombophlebitis of other deep vessels of lower extremities   . Allergic rhinitis due to pollen   . COPD (chronic obstructive pulmonary disease)   . CHF (congestive heart  failure)   . Reflux esophagitis   . Inguinal hernia without mention of obstruction or gangrene, unilateral or unspecified, (not specified as recurrent)   . Unspecified constipation   . Osteoarthrosis, unspecified whether generalized or localized, unspecified site   . Pain in joint, lower leg   . Sacroiliitis, not elsewhere classified   . Muscle weakness (generalized)   . Osteoporosis, unspecified   . Pathologic fracture of vertebrae   . Insomnia, unspecified   . Other malaise and fatigue   . Abnormality of gait   . Rash and other nonspecific skin eruption   . Edema   . Shortness of breath   . Dysphagia, oropharyngeal phase   . Unspecified urinary incontinence   . Electrocardiogram finding, abnormal, without diagnosis   . Hypoxemia   . Debility, unspecified   . Closed fracture of dorsal (thoracic) vertebra without mention of spinal cord injury   . Encounter for long-term (current) use of anticoagulants   . Kyphosis 10/13/09    kyphoplasty  . Rib fractures   . Complication of anesthesia     PNA s/p surgeries  . Hypothyroidism   . Blood transfusion   . GERD (gastroesophageal reflux disease)   . Pneumonia     Medications:  Scheduled:     . acetaminophen  1,000 mg Intravenous Q6H  . carbidopa-levodopa  1 tablet Oral TID  . ceFAZolin (ANCEF) IV  1 g Intravenous Q8H  . cefTRIAXone (ROCEPHIN) IV  1 g Intravenous Q24H  . cholecalciferol  400 Units Oral BID  . docusate sodium  100 mg Oral BID  . ferrous gluconate  324 mg Oral BID  . levalbuterol  1.25 mg Nebulization Once  . levothyroxine  100 mcg Oral QAM  . loratadine  10 mg Oral QPM  . pantoprazole  40 mg Oral Q1200  . polyethylene glycol  17 g Oral Daily  . predniSONE  2.5 mg Oral QPM  . sertraline  25 mg Oral QPM  . warfarin  5 mg Oral Once  . DISCONTD: ceFAZolin (ANCEF) IV  1 g Intravenous Once  . DISCONTD: enoxaparin  40 mg Subcutaneous Q24H  . DISCONTD: enoxaparin  40 mg Subcutaneous Q24H  . DISCONTD:  senna-docusate  1 tablet Oral QHS  . DISCONTD: zolpidem  2.5 mg Oral QHS   Infusions:     . sodium chloride Stopped (05/09/11 1320)   PRN: albuterol, albuterol, alum & mag hydroxide-simeth, guaiFENesin-dextromethorphan, ipratropium, methocarbamol(ROBAXIN) IV, methocarbamol, metoCLOPramide (REGLAN) injection, metoCLOPramide, morphine, ondansetron (ZOFRAN) IV, ondansetron, senna-docusate, traMADol, DISCONTD: acetaminophen, DISCONTD: acetaminophen, DISCONTD: acetaminophen, DISCONTD: dexamethasone, DISCONTD: HYDROcodone-acetaminophen, DISCONTD: HYDROcodone-acetaminophen DISCONTD: HYDROmorphone, DISCONTD: meperidine, DISCONTD: sodium chloride  Assessment: 77 yo F on chronic coumadin for hx of PE, now POD#1 R hip hemi-arthroplasty due to fall. Plan to resume coumadin tonight.  Home dose = warfarin 7mg  MWF and 5mg  TuThSS  Goal of Therapy:  INR 2-3   Plan:  Warfarin 7mg  PO x1 tonight PT/INR daily  Stacey Mccarthy R 05/10/2011,7:36 AM

## 2011-05-11 LAB — BASIC METABOLIC PANEL
BUN: 14 mg/dL (ref 6–23)
CO2: 28 mEq/L (ref 19–32)
Chloride: 108 mEq/L (ref 96–112)
GFR calc non Af Amer: 45 mL/min — ABNORMAL LOW (ref >90)
Glucose, Bld: 95 mg/dL (ref 70–99)
Potassium: 4.7 mEq/L (ref 3.5–5.1)

## 2011-05-11 LAB — CBC
HCT: 31.6 % — ABNORMAL LOW (ref 36.0–46.0)
Hemoglobin: 9.7 g/dL — ABNORMAL LOW (ref 12.0–15.0)
MCHC: 30.7 g/dL (ref 30.0–36.0)
RBC: 3.64 MIL/uL — ABNORMAL LOW (ref 3.87–5.11)

## 2011-05-11 LAB — URINE CULTURE: Culture  Setup Time: 201211102137

## 2011-05-11 MED ORDER — ACETAMINOPHEN 500 MG PO TABS
500.0000 mg | ORAL_TABLET | Freq: Three times a day (TID) | ORAL | Status: DC
  Administered 2011-05-11 – 2011-05-13 (×6): 500 mg via ORAL
  Filled 2011-05-11 (×11): qty 1

## 2011-05-11 MED ORDER — WARFARIN SODIUM 10 MG PO TABS
10.0000 mg | ORAL_TABLET | Freq: Once | ORAL | Status: AC
  Administered 2011-05-11: 10 mg via ORAL
  Filled 2011-05-11: qty 1

## 2011-05-11 MED ORDER — HYDROCODONE-ACETAMINOPHEN 10-325 MG PO TABS: 1.0000 | ORAL_TABLET | ORAL | Status: DC | PRN

## 2011-05-11 MED ORDER — HYDROCODONE-ACETAMINOPHEN 10-325 MG PO TABS: 1.0000 | ORAL_TABLET | Freq: Four times a day (QID) | ORAL | Status: DC | PRN

## 2011-05-11 NOTE — Progress Notes (Signed)
UR completed 

## 2011-05-11 NOTE — Progress Notes (Signed)
Occupational Therapy Evaluation Patient Details Name: Stacey Mccarthy MRN: [id number] DOB: 1916-01-31 Today's Date: 05/11/2011 Time: 10:25-10:59am Problem List:  Patient Active Problem List  Diagnoses  . Osteoporosis, unspecified  . Debility, unspecified  . Encounter for long-term (current) use of anticoagulants  . PE (pulmonary embolism)  . Hypothyroidism  . COPD (chronic obstructive pulmonary disease)  . Dysphagia  . Parkinson's disease  . Fall  . Hip fracture, right  . UTI (urinary tract infection)    Past Medical History:  Past Medical History  Diagnosis Date  . Arthritis   . Pulmonary embolism 11/2008  . Debility 10/17/09  . Asthma   . Coagulation defect 03/24/11  . Thyroid disease   . Hypoxemia   . Pneumathemia   . Depression   . Parkinson disease   . Sacroiliitis   . Osteoporosis   . Petechiae   . Dysphagia   . Hyperlipemia   . Neuropathy   . Low vision, one eye, not otherwise specified   . Unspecified essential hypertension   . Other pulmonary embolism and infarction   . Atrial fibrillation   . Heart failure   . Phlebitis and thrombophlebitis of other deep vessels of lower extremities   . Allergic rhinitis due to pollen   . COPD (chronic obstructive pulmonary disease)   . CHF (congestive heart failure)   . Reflux esophagitis   . Inguinal hernia without mention of obstruction or gangrene, unilateral or unspecified, (not specified as recurrent)   . Unspecified constipation   . Osteoarthrosis, unspecified whether generalized or localized, unspecified site   . Pain in joint, lower leg   . Sacroiliitis, not elsewhere classified   . Muscle weakness (generalized)   . Osteoporosis, unspecified   . Pathologic fracture of vertebrae   . Insomnia, unspecified   . Other malaise and fatigue   . Abnormality of gait   . Rash and other nonspecific skin eruption   . Edema   . Shortness of breath   . Dysphagia, oropharyngeal phase   . Unspecified urinary  incontinence   . Electrocardiogram finding, abnormal, without diagnosis   . Hypoxemia   . Debility, unspecified   . Closed fracture of dorsal (thoracic) vertebra without mention of spinal cord injury   . Encounter for long-term (current) use of anticoagulants   . Kyphosis 10/13/09    kyphoplasty  . Rib fractures   . Complication of anesthesia     PNA s/p surgeries  . Hypothyroidism   . Blood transfusion   . GERD (gastroesophageal reflux disease)   . Pneumonia    Past Surgical History:  Past Surgical History  Procedure Date  . Tonsillectomy and adenoidectomy   . Breast surgery 1970    Left Benign Breast Bx  . Cataract extraction, bilateral 1992/1995    IOL  . Pressure ulcer debridement     statis ulcer, right leg  . Bladder suspension 2001    AP  . Retinal tear repair cryotherapy   . Plantar fascia surgery   . Nasal sinus surgery   . Appendectomy 2004  . Back surgery     OT Assessment/Plan/Recommendation OT Assessment Clinical Impression Statement: Pt will benefit from SNF Rehab after acute stay, currently requiring +2 Total Assist (pt 10%) for transfer EOB to Chair OT Recommendation/Assessment: Patient will need skilled OT in the acute care venue OT Problem List: Decreased strength;Decreased activity tolerance;Impaired balance (sitting and/or standing);Decreased knowledge of use of DME or AE;Decreased knowledge of precautions;Pain OT Therapy Diagnosis : Generalized weakness;Acute  pain OT Plan OT Frequency: Min 2X/week OT Treatment/Interventions: Self-care/ADL training;Therapeutic activities;DME and/or AE instruction;Patient/family education OT Recommendation Follow Up Recommendations: Skilled nursing facility Equipment Recommended: Defer to next venue Individuals Consulted Consulted and Agree with Results and Recommendations: Patient;Family member/caregiver Family Member Consulted: Pt's son/daughter OT Goals Acute Rehab OT Goals OT Goal Formulation: With  family Time For Goal Achievement: 7 days ADL Goals Pt Will Perform Grooming: Sitting, edge of bed;Unsupported;with mod assist ADL Goal: Grooming - Progress: Progressing toward goals Pt Will Perform Upper Body Dressing: with mod assist;Sitting, bed;Unsupported ADL Goal: Upper Body Dressing - Progress: Progressing toward goals Pt Will Perform Lower Body Dressing: with max assist;Sitting, chair;Sitting, bed;Unsupported;with adaptive equipment ADL Goal: Lower Body Dressing - Progress: Progressing toward goals Additional ADL Goal #1: Pt will perform toileting transfers to 3:1/RW SPT all aspects with Max assist while maintaining posterior THP ADL Goal: Additional Goal #1 - Progress: Progressing toward goals  OT Evaluation Precautions/Restrictions  Precautions Precautions: Posterior Hip Precaution Booklet Issued: No Precaution Comments: Informed RN/tech of precautions. Wrote precautions and WB status on dry-erase board. Also informed patient during session. Restrictions Weight Bearing Restrictions: Yes RLE Weight Bearing: Weight bearing as tolerated Prior Functioning Home Living Type of Home: Apartment (ALF) Home Layout: One level Home Access: Level entry Bathroom Shower/Tub: Other (comment) (Aides give shower (wheel pt into, no transfers)) Bathroom Toilet: Handicapped height (wears depends per dtr, aides assist w/ secondary incontinenc) Bathroom Accessibility: Yes How Accessible: Accessible via walker Home Adaptive Equipment: Wheelchair - powered;Walker - rolling;Bedside commode/3-in-1;Other (comment) (gets "wheeled into shower" no transfers per family) Prior Function Level of Independence: Needs assistance with ADLs;Needs assistance with tranfers;Needs assistance with gait ADL ADL Grooming: Simulated;Minimal assistance Grooming Details (indicate cue type and reason): VC's Where Assessed - Grooming: Sitting, chair Upper Body Bathing: Simulated;Minimal assistance Upper Body Bathing  Details (indicate cue type and reason): VC's Where Assessed - Upper Body Bathing: Sitting, chair Lower Body Bathing: Simulated;+1 Total assistance Where Assessed - Lower Body Bathing: Supine, head of bed up Upper Body Dressing: Simulated;Minimal assistance Upper Body Dressing Details (indicate cue type and reason): VC/TC's Where Assessed - Upper Body Dressing: Sitting, chair Lower Body Dressing: Simulated;+1 Total assistance Lower Body Dressing Details (indicate cue type and reason): Pt lethargic, pain right hip  Where Assessed - Lower Body Dressing: Sitting, chair Toilet Transfer: Simulated;+2 Total assistance;Comment for patient % Toilet Transfer Details (indicate cue type and reason): Pt 10% SPT while maintaining Posterior THP R LE: attempted sit-stand x2 from EOB with +2 total assist & RW, pt approx 10%, pt unable to advance L LE and use UE's through RW. Therefore, RW removed and SPT to chair performed. Toilet Transfer Method: Surveyor, minerals: Customer service manager Used: Rolling walker;Other (comment) (Maxi move pad placed in chair, RN staff made aware) ADL Comments: Pt requiring +2 Total assist with transfers (pt approx 10%). Pt required assistance ADL's and selfcare PTA, and will benefit from acute OT followed by SNF Rehab Vision/Perception  Vision - History Baseline Vision: Wears glasses all the time Visual History: Other (comment) (Right eye closes & opens, pt was to F/U opthomologist 11/14) Patient Visual Report: No change from baseline;Other (comment) (Right eye closes & opens, pt was to F/U opthomologist 11/14) Cognition Cognition Arousal/Alertness: Lethargic Overall Cognitive Status: Appears within functional limits for tasks assessed Orientation Level: Oriented X4 Sensation/Coordination Sensation Light Touch: Appears Intact Coordination Gross Motor Movements are Fluid and Coordinated: Yes Fine Motor Movements are Fluid and Coordinated:  Yes Extremity Assessment RUE  Assessment RUE Assessment: Within Functional Limits LUE Assessment LUE Assessment: Within Functional Limits Mobility  Bed Mobility Bed Mobility: Yes Supine to Sit: 1: +2 Total assist;HOB elevated (Comment degrees) Supine to Sit Details (indicate cue type and reason): Pt 10% Sitting - Scoot to Edge of Bed: 1: +2 Total assist Sitting - Scoot to Edge of Bed Details (indicate cue type and reason): Pt 5%, Vcs for safety, technique. Pt unable to weight-shift. Utilized bed pad for scooting. Transfers Transfers: Yes Sit to Stand: 1: +2 Total assist;From bed;From elevated surface Sit to Stand Details (indicate cue type and reason): Pt 10% Stand to Sit: 1: +2 Total assist;To chair/3-in-1 Stand to Sit Details: VCs safety, technique. Assist to control descent Exercises   End of Session OT - End of Session Equipment Utilized During Treatment: Gait belt;Other (comment) (RW, Maxi Move pad in chair for RN staff to use) Activity Tolerance: Patient limited by fatigue;Patient limited by pain Patient left: in chair;with call bell in reach;with family/visitor present Nurse Communication: Mobility status for transfers;Need for lift equipment;Other (comment) (Posterior THP) General Behavior During Session: Lethargic Cognition: WFL for tasks performed   Alm Bustard 05/11/2011, 1:41 PM

## 2011-05-11 NOTE — Progress Notes (Signed)
Subjective: "I am tired of people poking and prodding me!" Denies pain  Objective: Vital signs Filed Vitals:   05/10/11 1832 05/10/11 2129 05/11/11 0225 05/11/11 0601  BP: 112/64 122/84 114/62 121/71  Pulse: 91 84 82 80  Temp: 97.6 F (36.4 C) 98.3 F (36.8 C) 98.7 F (37.1 C) 98.6 F (37 C)  TempSrc:  Oral Oral Oral  Resp: 20 20 18 18   Height:      Weight:    81.9 kg (180 lb 8.9 oz)  SpO2: 97% 96% 95% 94%   Weight change: -3.3 kg (-7 lb 4.4 oz) Last BM Date: 05/07/11  Intake/Output from previous day: 11/12 0701 - 11/13 0700 In: 1160 [P.O.:1160] Out: 1950 [Urine:1950]     Physical Exam: General: Alert, awake, oriented x3, in no acute distress. HEENT: No bruits, no goiter. Heart: Regular rate and rhythm, without murmurs, rubs, gallops. Lungs: Clear to auscultation bilaterally. Abdomen: Soft, nontender, nondistended, positive bowel sounds. Extremities: No clubbing cyanosis or edema with positive pedal pulses. Neuro: Grossly intact, nonfocal.    Lab Results: Basic Metabolic Panel:  Basename 05/11/11 0507 05/10/11 0445  NA 142 137  K 4.7 4.8  CL 108 103  CO2 28 26  GLUCOSE 95 133*  BUN 14 16  CREATININE 1.04 1.05  CALCIUM 8.6 8.5  MG -- --  PHOS -- --   Liver Function Tests: No results found for this basename: AST:2,ALT:2,ALKPHOS:2,BILITOT:2,PROT:2,ALBUMIN:2 in the last 72 hours No results found for this basename: LIPASE:2,AMYLASE:2 in the last 72 hours No results found for this basename: AMMONIA:2 in the last 72 hours CBC:  Basename 05/11/11 0507 05/10/11 0445 05/08/11 1438  WBC 7.3 8.4 --  NEUTROABS -- -- 16.0*  HGB 9.7* 10.1* --  HCT 31.6* 32.6* --  MCV 86.8 86.2 --  PLT 150 160 --   Cardiac Enzymes: No results found for this basename: CKTOTAL:3,CKMB:3,CKMBINDEX:3,TROPONINI:3 in the last 72 hours BNP: No results found for this basename: POCBNP:3 in the last 72 hours D-Dimer: No results found for this basename: DDIMER:2 in the last 72  hours CBG: No results found for this basename: GLUCAP:6 in the last 72 hours Hemoglobin A1C: No results found for this basename: HGBA1C in the last 72 hours Fasting Lipid Panel: No results found for this basename: CHOL,HDL,LDLCALC,TRIG,CHOLHDL,LDLDIRECT in the last 72 hours Thyroid Function Tests:  Basename 05/09/11 0530  TSH 3.803  T4TOTAL --  FREET4 --  T3FREE --  THYROIDAB --   Anemia Panel: No results found for this basename: VITAMINB12,FOLATE,FERRITIN,TIBC,IRON,RETICCTPCT in the last 72 hours Coagulation:  Basename 05/11/11 0507 05/10/11 0445  LABPROT 14.3 15.6*  INR 1.09 1.21   Urine Drug Screen:  Alcohol Level: No results found for this basename: ETH:2 in the last 72 hours Urinalysis:  Misc. Labs:  Recent Results (from the past 240 hour(s))  URINE CULTURE     Status: Normal   Collection Time   05/08/11  3:58 PM      Component Value Range Status Comment   Specimen Description URINE, CLEAN CATCH   Final    Special Requests NONE   Final    Setup Time [id number]   Final    Colony Count >=100,000 COLONIES/ML   Final    Culture KLEBSIELLA PNEUMONIAE   Final    Report Status 05/11/2011 FINAL   Final    Organism ID, Bacteria KLEBSIELLA PNEUMONIAE   Final     Studies/Results: Dg Hip Complete Right  05/09/2011  *RADIOLOGY REPORT*  Clinical Data: Right hip fracture.  RIGHT HIP - COMPLETE 2+ VIEW  Comparison: 05/08/2011  Findings: The patient has undergone a right hip hemiarthroplasty. The prosthesis appears in good position.  No fractures.  IMPRESSION: Satisfactory appearance of the right hip after right hemiarthroplasty.  Original Report Authenticated By: Gwynn Burly, M.D.    Medications: Scheduled Meds:   . carbidopa-levodopa  1 tablet Oral TID  . cefTRIAXone (ROCEPHIN) IV  1 g Intravenous Q24H  . cholecalciferol  400 Units Oral BID  . docusate sodium  100 mg Oral BID  . enoxaparin (LOVENOX) injection  40 mg Subcutaneous Q24H  . ferrous gluconate  324  mg Oral BID  . levothyroxine  100 mcg Oral QAM  . loratadine  10 mg Oral QPM  . pantoprazole  40 mg Oral Q1200  . polyethylene glycol  17 g Oral Daily  . predniSONE  2.5 mg Oral QPM  . sertraline  25 mg Oral QPM  . warfarin  10 mg Oral ONCE-1800  . warfarin  7 mg Oral ONCE-1800   Continuous Infusions:   . DISCONTD: sodium chloride Stopped (05/09/11 1320)   PRN Meds:.albuterol, albuterol, alum & mag hydroxide-simeth, guaiFENesin-dextromethorphan, ipratropium, methocarbamol(ROBAXIN) IV, methocarbamol, metoCLOPramide (REGLAN) injection, metoCLOPramide, morphine, ondansetron (ZOFRAN) IV, ondansetron, polyethylene glycol, senna-docusate, traMADol  Assessment/Plan:  Principal Problem: 1. *Hip fracture, right per ortho. S/P arthroplasty 11/11. PT, pain mngment. Pt. Somewhat somnolent at time of my exam. Had morphine 2 hours prior. Will adjust pain meds. Active Problems: 2. Encounter for long-term (current) use of anticoagulants 3. PE (pulmonary embolism). Coumadin per pharmacy. Family aware of risks.  4. Hypothyroidism cont synthroid 5 COPD (chronic obstructive pulmonary disease). Stable at baseline. Cont nebs 6. Dysphagia: aspirated during breakfast this am. Recovered quickly. Will monitor closely for sats and get chest xray if indicated 7. Parkinson's disease: stable 8. Fall See #1 9. UTI (urinary tract infection) gm neg rods. Rocephin day#3.    LOS: 3 days   [facility] M 05/11/2011, 1:49 PM  I have seen and examined the patient. Agree with the note and plan as outlined by Ms. Black NP. Agree on cutting down the narcotics, post- op day 2 today, place on scheduled tylenol ES and PRN vicodin only. DC to skilled nursing facility tomorrow.  [place] Hanlon 05/11/2011 2:15 PM

## 2011-05-11 NOTE — Clinical Documentation Improvement (Signed)
Please update your documentation within the medical record to reflect your response to this query.                                                                                   05/11/11  Dear Dr. Isidoro Donning Marton Redwood  In a better effort to capture your patient's severity of illness, reflect appropriate length of stay and utilization of resources, a review of the medical record has revealed the following indicators.    Based on your clinical judgment, please clarify and document in a progress note and/or discharge summary the clinical condition associated with the following supporting information:   Pt admitted for R Hemiarthroplasty.  Post Op H/H= 9.7/31.6 with ESBL=319ml  Please clarify the underlying diagnosis responsible for the abn lab value and document in pn or d/c summary.  Other Condition__Post op anemia stable________________                   Cannot Clinically Determine_________    Risk Factors: R hemiarthroplasty, UTI, Osteoporosis, HLD  Diagnostics: 05/09/11 ESBL:  05/10/2011 04:45 HGB: 10.1 (L) HCT: 32.6 (L)  05/11/2011 05:07 HGB: 9.7 (L) HCT: 31.6 (L)  Treatment: Monitoring H/H Ferrous Gluconate  In responding to this query please exercise your independent judgment.  The fact that a query is asked, does not imply that any particular answer is desired or expected.  Abnormal findings (laboratory, x-ray, pathologic, and other diagnostic results) are not coded and reported unless the physician indicates their clinical significance.   The medical record reflects the following clinical findings, please clarify the diagnostic and/or clinical significance:      Reviewed:  no additional documentation provided    Thank You,  Enis Slipper  Clinical Documentation Specialist Office

## 2011-05-11 NOTE — Consult Note (Signed)
Subjective: Pain well controlled, slightly confused now but laert post one dose of morphine early today per nursing; removed monitor on her own  Objective: Vital signs in last 24 hours: Temp:  [98.3 F (36.8 C)-98.7 F (37.1 C)] 98.3 F (36.8 C) (11/13 1300) Pulse Rate:  [80-86] 86  (11/13 1300) Resp:  [18-20] 20  (11/13 1300) BP: (110-122)/(62-84) 110/69 mmHg (11/13 1300) SpO2:  [94 %-97 %] 97 % (11/13 1300) Weight:  [81.9 kg (180 lb 8.9 oz)] 180 lb 8.9 oz (81.9 kg) (11/13 0601) Weight change: -3.3 kg (-7 lb 4.4 oz) Last BM Date: 05/07/11  Intake/Output from previous day: 11/12 0701 - 11/13 0700 In: 1160 [P.O.:1160] Out: 1950 [Urine:1950] Intake/Output this shift:   Neck shows skin reaction/ bruising from oxygen tubing; R hip wound drsg changed, incision well approximated, no edema, no current drainage; intact distal sensation and motor; pretibial skin tear with very slight drainage; L leg drsg no drainage  Lab Results:  [facility] 05/11/11 0507 05/10/11 0445  WBC 7.3 8.4  HGB 9.7* 10.1*  HCT 31.6* 32.6*  PLT 150 160   BMET  Basename 05/11/11 0507 05/10/11 0445  NA 142 137  K 4.7 4.8  CL 108 103  CO2 28 26  GLUCOSE 95 133*  BUN 14 16  CREATININE 1.04 1.05  CALCIUM 8.6 8.5    Studies/Results: No results found.  Medications: I have reviewed the patient's current medications. DVT prophylaxis: Lovenox bridge to coumadin  Assessment/Plan: Leave foley because of incontinence and severely fragile skin; breakdown risk too high with bedpan at this time Weight bearing: as tolerated, post hip precautions   LOS: 3 days   Asuncion Shibata H 05/11/2011, 9:17 PM

## 2011-05-11 NOTE — Progress Notes (Signed)
ANTICOAGULATION CONSULT NOTE - Initial Consult  Pharmacy Consult for Warfarin  Indication: Hx of PE, now s/p R hip hemi-arthroplasty  Patient Measurements: Height: 5\' 6"  (167.6 cm) Weight: 180 lb 8.9 oz (81.9 kg) IBW/kg (Calculated) : 59.3   Vital Signs: Temp: 98.6 F (37 C) (11/13 0601) Temp src: Oral (11/13 0601) BP: 121/71 mmHg (11/13 0601) Pulse Rate: 80  (11/13 0601)  Labs:  Basename 05/11/11 0507 05/10/11 0445 05/09/11 0530 05/08/11 1438  HGB 9.7* 10.1* -- --  HCT 31.6* 32.6* 40.5 --  PLT 150 160 194 --  APTT -- -- -- 28  LABPROT 14.3 15.6* 17.8* --  INR 1.09 1.21 1.44 --  HEPARINUNFRC -- -- -- --  CREATININE 1.04 1.05 1.04 --  CKTOTAL -- -- -- --  CKMB -- -- -- --  TROPONINI -- -- -- --   Estimated Creatinine Clearance: 37.2 ml/min (by C-G formula based on Cr of 1.04).  Medical History: Past Medical History  Diagnosis Date  . Arthritis   . Pulmonary embolism 11/2008  . Debility 10/17/09  . Asthma   . Coagulation defect 03/24/11  . Thyroid disease   . Hypoxemia   . Pneumathemia   . Depression   . Parkinson disease   . Sacroiliitis   . Osteoporosis   . Petechiae   . Dysphagia   . Hyperlipemia   . Neuropathy   . Low vision, one eye, not otherwise specified   . Unspecified essential hypertension   . Other pulmonary embolism and infarction   . Atrial fibrillation   . Heart failure   . Phlebitis and thrombophlebitis of other deep vessels of lower extremities   . Allergic rhinitis due to pollen   . COPD (chronic obstructive pulmonary disease)   . CHF (congestive heart failure)   . Reflux esophagitis   . Inguinal hernia without mention of obstruction or gangrene, unilateral or unspecified, (not specified as recurrent)   . Unspecified constipation   . Osteoarthrosis, unspecified whether generalized or localized, unspecified site   . Pain in joint, lower leg   . Sacroiliitis, not elsewhere classified   . Muscle weakness (generalized)   . Osteoporosis,  unspecified   . Pathologic fracture of vertebrae   . Insomnia, unspecified   . Other malaise and fatigue   . Abnormality of gait   . Rash and other nonspecific skin eruption   . Edema   . Shortness of breath   . Dysphagia, oropharyngeal phase   . Unspecified urinary incontinence   . Electrocardiogram finding, abnormal, without diagnosis   . Hypoxemia   . Debility, unspecified   . Closed fracture of dorsal (thoracic) vertebra without mention of spinal cord injury   . Encounter for long-term (current) use of anticoagulants   . Kyphosis 10/13/09    kyphoplasty  . Rib fractures   . Complication of anesthesia     PNA s/p surgeries  . Hypothyroidism   . Blood transfusion   . GERD (gastroesophageal reflux disease)   . Pneumonia     Medications:  Scheduled:     . acetaminophen  1,000 mg Intravenous Q6H  . carbidopa-levodopa  1 tablet Oral TID  . ceFAZolin (ANCEF) IV  1 g Intravenous Q8H  . cefTRIAXone (ROCEPHIN) IV  1 g Intravenous Q24H  . cholecalciferol  400 Units Oral BID  . docusate sodium  100 mg Oral BID  . enoxaparin (LOVENOX) injection  40 mg Subcutaneous Q24H  . ferrous gluconate  324 mg Oral BID  . levothyroxine  100 mcg Oral QAM  . loratadine  10 mg Oral QPM  . pantoprazole  40 mg Oral Q1200  . polyethylene glycol  17 g Oral Daily  . predniSONE  2.5 mg Oral QPM  . sertraline  25 mg Oral QPM  . warfarin  7 mg Oral ONCE-1800   Infusions:     . DISCONTD: sodium chloride Stopped (05/09/11 1320)   PRN: albuterol, albuterol, alum & mag hydroxide-simeth, guaiFENesin-dextromethorphan, ipratropium, methocarbamol(ROBAXIN) IV, methocarbamol, metoCLOPramide (REGLAN) injection, metoCLOPramide, morphine, ondansetron (ZOFRAN) IV, ondansetron, polyethylene glycol, senna-docusate, traMADol  Assessment: 77 yo F on chronic coumadin for hx of PE, now POD#2 R hip hemi-arthroplasty due to fall.  Coumadin resumed 11/11.  Home dose = warfarin 7mg  MWF and 5mg  TuThSS  Goal of  Therapy:  INR 2-3   Plan:  Increase coumadin 10mg  po today Continue Lovenox 40mg  SQ q24h until INR therapeutic PT/INR daily  Rollene Fare 05/11/2011,8:08 AM

## 2011-05-12 ENCOUNTER — Encounter (HOSPITAL_COMMUNITY): Payer: Self-pay | Admitting: Orthopedic Surgery

## 2011-05-12 LAB — BASIC METABOLIC PANEL
BUN: 15 mg/dL (ref 6–23)
CO2: 29 mEq/L (ref 19–32)
Chloride: 104 mEq/L (ref 96–112)
Creatinine, Ser: 1 mg/dL (ref 0.50–1.10)
Glucose, Bld: 98 mg/dL (ref 70–99)
Potassium: 4 mEq/L (ref 3.5–5.1)

## 2011-05-12 LAB — CBC
HCT: 32.3 % — ABNORMAL LOW (ref 36.0–46.0)
Hemoglobin: 9.5 g/dL — ABNORMAL LOW (ref 12.0–15.0)
MCV: 88.7 fL (ref 78.0–100.0)
RBC: 3.64 MIL/uL — ABNORMAL LOW (ref 3.87–5.11)
WBC: 5.8 10*3/uL (ref 4.0–10.5)

## 2011-05-12 MED ORDER — WARFARIN SODIUM 10 MG PO TABS
10.0000 mg | ORAL_TABLET | ORAL | Status: AC
  Administered 2011-05-12: 10 mg via ORAL
  Filled 2011-05-12: qty 1

## 2011-05-12 MED ORDER — HYDROCODONE-ACETAMINOPHEN 5-325 MG PO TABS
1.0000 | ORAL_TABLET | Freq: Four times a day (QID) | ORAL | Status: DC | PRN
  Administered 2011-05-13: 1 via ORAL
  Filled 2011-05-12: qty 1

## 2011-05-12 MED ORDER — AMPICILLIN 250 MG PO CAPS
250.0000 mg | ORAL_CAPSULE | Freq: Three times a day (TID) | ORAL | Status: DC
  Administered 2011-05-12 – 2011-05-13 (×4): 250 mg via ORAL
  Filled 2011-05-12 (×7): qty 1

## 2011-05-12 MED ORDER — ENOXAPARIN (LOVENOX) PATIENT EDUCATION KIT
PACK | Freq: Once | Status: AC
  Administered 2011-05-12: 09:00:00
  Filled 2011-05-12: qty 1

## 2011-05-12 MED ORDER — KETOROLAC TROMETHAMINE 10 MG PO TABS
10.0000 mg | ORAL_TABLET | Freq: Four times a day (QID) | ORAL | Status: DC | PRN
  Filled 2011-05-12: qty 1

## 2011-05-12 NOTE — Progress Notes (Signed)
Subjective: "I am very tired". Reports 6am pain med helped pain.  Objective: Vital signs Filed Vitals:   05/11/11 0601 05/11/11 1300 05/11/11 2100 05/12/11 0643  BP: 121/71 110/69 119/60 106/75  Pulse: 80 86 88 84  Temp: 98.6 F (37 C) 98.3 F (36.8 C) 97.4 F (36.3 C) 98.6 F (37 C)  TempSrc: Oral Oral    Resp: 18 20 19 20   Height:      Weight: 81.9 kg (180 lb 8.9 oz)   87.9 kg (193 lb 12.6 oz)  SpO2: 94% 97% 97% 97%   Weight change: 6 kg (13 lb 3.6 oz) Last BM Date: 05/07/11  Intake/Output from previous day: 11/13 0701 - 11/14 0700 In: 340 [P.O.:340] Out: 1000 [Urine:1000]     Physical Exam: General: Eyes closed. Easily aroused. Somnolent. NAD HEENT: No bruits, no goiter. Heart: Regular rate and rhythm, without murmurs, rubs, gallops. Lungs: Clear to auscultation bilaterally. Somewhat shallow. No wheeze, rhonchi, rales Abdomen: Soft, nontender, nondistended, positive bowel sounds. Extremities: No clubbing cyanosis or edema with positive pedal pulses. Neuro: Grossly intact, nonfocal. Speech slow but clear.  Skin: bruising on UE and neck from O2 tubing   Lab Results: Basic Metabolic Panel:  Basename 05/12/11 0507 05/11/11 0507  NA 140 142  K 4.0 4.7  CL 104 108  CO2 29 28  GLUCOSE 98 95  BUN 15 14  CREATININE 1.00 1.04  CALCIUM 8.5 8.6  MG -- --  PHOS -- --   Liver Function Tests: No results found for this basename: AST:2,ALT:2,ALKPHOS:2,BILITOT:2,PROT:2,ALBUMIN:2 in the last 72 hours No results found for this basename: LIPASE:2,AMYLASE:2 in the last 72 hours No results found for this basename: AMMONIA:2 in the last 72 hours CBC:  Basename 05/12/11 0507 05/11/11 0507  WBC 5.8 7.3  NEUTROABS -- --  HGB 9.5* 9.7*  HCT 32.3* 31.6*  MCV 88.7 86.8  PLT 164 150   Cardiac Enzymes: No results found for this basename: CKTOTAL:3,CKMB:3,CKMBINDEX:3,TROPONINI:3 in the last 72 hours BNP: No results found for this basename: POCBNP:3 in the last 72  hours D-Dimer: No results found for this basename: DDIMER:2 in the last 72 hours CBG: No results found for this basename: GLUCAP:6 in the last 72 hours Hemoglobin A1C: No results found for this basename: HGBA1C in the last 72 hours Fasting Lipid Panel: No results found for this basename: CHOL,HDL,LDLCALC,TRIG,CHOLHDL,LDLDIRECT in the last 72 hours Thyroid Function Tests: No results found for this basename: TSH,T4TOTAL,FREET4,T3FREE,THYROIDAB in the last 72 hours Anemia Panel: No results found for this basename: VITAMINB12,FOLATE,FERRITIN,TIBC,IRON,RETICCTPCT in the last 72 hours Coagulation:  Basename 05/12/11 0507 05/11/11 0507  LABPROT 14.6 14.3  INR 1.12 1.09   Urine Drug Screen:  Alcohol Level: No results found for this basename: ETH:2 in the last 72 hours Urinalysis:  Misc. Labs:  Recent Results (from the past 240 hour(s))  URINE CULTURE     Status: Normal   Collection Time   05/08/11  3:58 PM      Component Value Range Status Comment   Specimen Description URINE, CLEAN CATCH   Final    Special Requests NONE   Final    Setup Time [id number]   Final    Colony Count >=100,000 COLONIES/ML   Final    Culture KLEBSIELLA PNEUMONIAE   Final    Report Status 05/11/2011 FINAL   Final    Organism ID, Bacteria KLEBSIELLA PNEUMONIAE   Final     Studies/Results: No results found.  Medications: Scheduled Meds:   . acetaminophen  500 mg Oral TID  . carbidopa-levodopa  1 tablet Oral TID  . cefTRIAXone (ROCEPHIN) IV  1 g Intravenous Q24H  . cholecalciferol  400 Units Oral BID  . docusate sodium  100 mg Oral BID  . enoxaparin (LOVENOX) injection  40 mg Subcutaneous Q24H  . enoxaparin   Does not apply Once  . ferrous gluconate  324 mg Oral BID  . levothyroxine  100 mcg Oral QAM  . loratadine  10 mg Oral QPM  . pantoprazole  40 mg Oral Q1200  . polyethylene glycol  17 g Oral Daily  . predniSONE  2.5 mg Oral QPM  . sertraline  25 mg Oral QPM  . warfarin  10 mg Oral  ONCE-1800  . warfarin  10 mg Oral NOW   Continuous Infusions:  PRN Meds:.albuterol, albuterol, alum & mag hydroxide-simeth, guaiFENesin-dextromethorphan, HYDROcodone-acetaminophen, ipratropium, methocarbamol(ROBAXIN) IV, methocarbamol, metoCLOPramide (REGLAN) injection, metoCLOPramide, ondansetron (ZOFRAN) IV, ondansetron, polyethylene glycol, senna-docusate, traMADol, DISCONTD: HYDROcodone-acetaminophen, DISCONTD: morphine  Assessment/Plan:  Principal Problem: 1 *Hip fracture, right: Post op day 3. PT and pain management. Per Ortho. Pain meds adjusted. Prob discharge to [facility] 11/15 or 11/16 per ortho Active Problems: 2. Encounter for long-term (current) use of anticoagulants: per pharmacy 3 PE (pulmonary embolism): Coumadin per pharm. INR 1.12 4. Hypothyroidism: cont synthroid 5. COPD (chronic obstructive pulmonary disease): Stable at baseline. Cont nebs 6. Dysphagia: unclear Hx as patient is tolerating regular diet without any issues 7. Parkinson's disease: stable 8 Fall: see #1 9. UTI (urinary tract infection)Kleb pne. Rocephin day #4. Will change to po ampicillin 10: Dispo: to [facility] 24-48 hrs.   LOS: 4 days   [facility] M 05/12/2011, 8:52 AM  I have seen and examined the patient. Agree with the note and plan as outlined by NP, Ms. Vedia Coffer. Agree with minimizing narcotic use. At my encounter, patient is eating, alert and oriented. Will monitor today and DC to SNF in AM, discussed with daughter in room in detail.  Jadis Mika 05/12/2011 12:50 PM

## 2011-05-12 NOTE — Progress Notes (Signed)
ANTICOAGULATION CONSULT NOTE - FOLLOW UP  Pharmacy Consult for Warfarin/Lovenox Indication: Hx of PE, now s/p R hip hemi-arthroplasty  Patient Measurements: Height: 5\' 6"  (167.6 cm) Weight: 193 lb 12.6 oz (87.9 kg) IBW/kg (Calculated) : 59.3   Vital Signs: Temp: 98.6 F (37 C) (11/14 0643) BP: 106/75 mmHg (11/14 0643) Pulse Rate: 84  (11/14 0643)  Labs:  Basename 05/12/11 0507 05/11/11 0507 05/10/11 0445  HGB 9.5* 9.7* --  HCT 32.3* 31.6* 32.6*  PLT 164 150 160  APTT -- -- --  LABPROT 14.6 14.3 15.6*  INR 1.12 1.09 1.21  HEPARINUNFRC -- -- --  CREATININE 1.00 1.04 1.05  CKTOTAL -- -- --  CKMB -- -- --  TROPONINI -- -- --   Estimated Creatinine Clearance: 40.1 ml/min (by C-G formula based on Cr of 1).    Medications:  Scheduled:     . acetaminophen  500 mg Oral TID  . carbidopa-levodopa  1 tablet Oral TID  . cefTRIAXone (ROCEPHIN) IV  1 g Intravenous Q24H  . cholecalciferol  400 Units Oral BID  . docusate sodium  100 mg Oral BID  . enoxaparin (LOVENOX) injection  40 mg Subcutaneous Q24H  . ferrous gluconate  324 mg Oral BID  . levothyroxine  100 mcg Oral QAM  . loratadine  10 mg Oral QPM  . pantoprazole  40 mg Oral Q1200  . polyethylene glycol  17 g Oral Daily  . predniSONE  2.5 mg Oral QPM  . sertraline  25 mg Oral QPM  . warfarin  10 mg Oral ONCE-1800       Assessment: 77 yo F on chronic coumadin for hx of PE, now POD#3 R hip hemi-arthroplasty due to fall.  Coumadin resumed 11/11, with surprisingly little response (INR 1.2 today) on boosted doses of 10mg  yesterday.  Home dose = warfarin 7mg  MWF and 5mg  TuThSS  Goal of Therapy:  INR 2-3   Plan:  1. Give coumadin 10mg  po today before discharge 2. Instruct patient to take 10mg  tomorrow (has 5mg  tabs at home) 3. INR will be checked Friday 4. Continue Lovenox 40mg  SQ q24h until INR therapeutic - will make sure she gets a dose today before discharge, RN to provide instructions for  administration  Rollene Fare 05/12/2011,8:31 AM

## 2011-05-12 NOTE — Progress Notes (Signed)
Physical Therapy Treatment Patient Details Name: Stacey Mccarthy MRN: [id number] DOB: [birth year] Today's Date: 05/12/2011 Time: 1330-1402   2 TA PT Assessment/Plan  PT - Assessment/Plan Comments on Treatment Session: Pt progressing slowly. Able to work on static standing endurance. Pt continues to require +2 assist. Continue to recommend SNF.  PT Plan: Discharge plan remains appropriate PT Goals  Acute Rehab PT Goals PT Goal: Supine/Side to Sit - Progress: Progressing toward goal PT Transfer Goal: Sit to Stand/Stand to Sit - Progress: Progressing toward goal PT Goal: Perform Home Exercise Program - Progress: Progressing toward goal  PT Treatment Precautions/Restrictions  Precautions Precautions: Posterior Hip Precaution Booklet Issued: No Precaution Comments: Informed RN/tech of precautions. Wrote precautions and WB status on dry-erase board. Also informed patient during session. Restrictions Weight Bearing Restrictions: Yes RLE Weight Bearing: Weight bearing as tolerated Mobility (including Balance) Bed Mobility Supine to Sit: 1: +2 Total assist;HOB elevated (Comment degrees) (Pt=15%) Supine to Sit Details (indicate cue type and reason): VCs safety, technique, encouraged pt to assist with UEs. Assist for trunk to upright and LEs off bed. Utilized bed pad to assist with positioning. Sitting - Scoot to Edge of Bed: 1: +2 Total assist (Pt=15%) Sitting - Scoot to Edge of Bed Details (indicate cue type and reason): VCs safety, technique. Utilized bed pad to scoot to EOB. Pt unable to weight-shift.  Transfers Transfers: Yes Sit to Stand: 1: +2 Total assist;From bed;From elevated surface;With upper extremity assist (Pt=25%) Sit to Stand Details (indicate cue type and reason): VCs safety, technique, hand placement. Assist to properly position LEs. Assist to rise, support pt in standing. 3 standing trials: 30 seconds x 1, 20 seconds x 1, 30 seconds x 1.        Stand to Sit: 1: +2 Total  assist;To chair/3-in-1 (Pt=25%) Stand to Sit Details: VCs safety, technique, hand placment. Assist to control descent and to slide R LE forward before descent. Squat Pivot Transfer Details (indicate cue type and reason): Pt unable. While in standing, 3rd person pushed recliner up behind pt for sitting.  Posture/Postural Control Posture/Postural Control: Postural limitations Postural Limitations: Pt leaning to L side in sitting. Able to self-correct with cues but fatigues easily. Exercise  General Exercises - Lower Extremity Ankle Circles/Pumps: AROM;Both;10 reps Quad Sets: AROM;10 reps;Both End of Session PT - End of Session Activity Tolerance: Patient limited by pain;Patient limited by fatigue Patient left: in chair;with call bell in reach;with family/visitor present General Behavior During Session: Lethargic Cognition: WFL for tasks performed  Rebeca Alert [facility] 05/12/2011, 3:29 PM

## 2011-05-13 LAB — CBC
Hemoglobin: 8.9 g/dL — ABNORMAL LOW (ref 12.0–15.0)
MCHC: 29.5 g/dL — ABNORMAL LOW (ref 30.0–36.0)
Platelets: 173 10*3/uL (ref 150–400)

## 2011-05-13 LAB — PROTIME-INR
INR: 1.48 (ref 0.00–1.49)
Prothrombin Time: 18.2 seconds — ABNORMAL HIGH (ref 11.6–15.2)

## 2011-05-13 MED ORDER — IPRATROPIUM BROMIDE 0.02 % IN SOLN: 0.5000 mg | Freq: Four times a day (QID) | RESPIRATORY_TRACT | Status: DC | PRN

## 2011-05-13 MED ORDER — AMPICILLIN 250 MG PO CAPS: 250.0000 mg | ORAL_CAPSULE | Freq: Three times a day (TID) | ORAL | Status: AC

## 2011-05-13 MED ORDER — ACETAMINOPHEN 500 MG PO TABS: 500.0000 mg | ORAL_TABLET | Freq: Three times a day (TID) | ORAL | Status: AC

## 2011-05-13 MED ORDER — SENNA-DOCUSATE SODIUM 8.6-50 MG PO TABS: 2.0000 | ORAL_TABLET | Freq: Every day | ORAL | Status: DC

## 2011-05-13 MED ORDER — WARFARIN SODIUM 10 MG PO TABS: ORAL_TABLET | ORAL | Status: DC

## 2011-05-13 MED ORDER — BISACODYL 5 MG PO TBEC: 5.0000 mg | DELAYED_RELEASE_TABLET | Freq: Every day | ORAL | Status: AC | PRN

## 2011-05-13 MED ORDER — ENOXAPARIN SODIUM 40 MG/0.4ML ~~LOC~~ SOLN: 40.0000 mg | SUBCUTANEOUS | Status: DC

## 2011-05-13 MED ORDER — WARFARIN SODIUM 10 MG PO TABS
10.0000 mg | ORAL_TABLET | Freq: Once | ORAL | Status: AC
  Administered 2011-05-13: 10 mg via ORAL
  Filled 2011-05-13: qty 1

## 2011-05-13 NOTE — Progress Notes (Signed)
Patient d/c to [facility] alert and oriented, PIV discontinued, no s/s of infiltration or infection noted. Still with multiple bruises and hematoma in BLE and BUE and R side of neck.

## 2011-05-13 NOTE — Progress Notes (Signed)
Subjective: "I am not ready to go. I am too weak and tired".  Objective: Vital signs Filed Vitals:   05/12/11 0643 05/12/11 1300 05/12/11 2117 05/13/11 0502  BP: 106/75 103/68 118/71 114/71  Pulse: 84 100 90 82  Temp: 98.6 F (37 C) 97.8 F (36.6 C) 97.8 F (36.6 C) 97.8 F (36.6 C)  TempSrc:  Oral Oral Oral  Resp: 20 20 18 18   Height:      Weight: 87.9 kg (193 lb 12.6 oz)   88.1 kg (194 lb 3.6 oz)  SpO2: 97% 95% 97% 99%   Weight change: 0.2 kg (7.1 oz) Last BM Date: 05/07/11  Intake/Output from previous day: 11/14 0701 - 11/15 0700 In: 580 [P.O.:580] Out: 925 [Urine:925] Total I/O In: 120 [P.O.:120] Out: -    Physical Exam: General: Lying in bed, Alert, awake, oriented x3, in no acute distress. HEENT: No bruits, no goiter. Oral cavity moist/pink. PERRL Heart: Regular rate and rhythm, without murmurs, rubs, gallops. Lungs Normal effort Clear to auscultation bilaterally. No wheeze, rhonchi Abdomen: Soft, nontender, nondistended, positive bowel sounds. Extremities: No clubbing cyanosis or edema with positive pedal pulses. Neuro: Grossly intact, nonfocal. Speech clear facial symmetry.    Lab Results: Basic Metabolic Panel:  Basename 05/12/11 0507 05/11/11 0507  NA 140 142  K 4.0 4.7  CL 104 108  CO2 29 28  GLUCOSE 98 95  BUN 15 14  CREATININE 1.00 1.04  CALCIUM 8.5 8.6  MG -- --  PHOS -- --   Liver Function Tests: No results found for this basename: AST:2,ALT:2,ALKPHOS:2,BILITOT:2,PROT:2,ALBUMIN:2 in the last 72 hours No results found for this basename: LIPASE:2,AMYLASE:2 in the last 72 hours No results found for this basename: AMMONIA:2 in the last 72 hours CBC:  Basename 05/13/11 0430 05/12/11 0507  WBC 5.3 5.8  NEUTROABS -- --  HGB 8.9* 9.5*  HCT 30.2* 32.3*  MCV 88.6 88.7  PLT 173 164   Cardiac Enzymes: No results found for this basename: CKTOTAL:3,CKMB:3,CKMBINDEX:3,TROPONINI:3 in the last 72 hours BNP: No results found for this basename:  POCBNP:3 in the last 72 hours D-Dimer: No results found for this basename: DDIMER:2 in the last 72 hours CBG: No results found for this basename: GLUCAP:6 in the last 72 hours Hemoglobin A1C: No results found for this basename: HGBA1C in the last 72 hours Fasting Lipid Panel: No results found for this basename: CHOL,HDL,LDLCALC,TRIG,CHOLHDL,LDLDIRECT in the last 72 hours Thyroid Function Tests: No results found for this basename: TSH,T4TOTAL,FREET4,T3FREE,THYROIDAB in the last 72 hours Anemia Panel: No results found for this basename: VITAMINB12,FOLATE,FERRITIN,TIBC,IRON,RETICCTPCT in the last 72 hours Coagulation:  Basename 05/13/11 0652 05/12/11 0507  LABPROT 18.2* 14.6  INR 1.48 1.12   Urine Drug Screen:  Alcohol Level: No results found for this basename: ETH:2 in the last 72 hours Urinalysis:  Misc. Labs:  Recent Results (from the past 240 hour(s))  URINE CULTURE     Status: Normal   Collection Time   05/08/11  3:58 PM      Component Value Range Status Comment   Specimen Description URINE, CLEAN CATCH   Final    Special Requests NONE   Final    Setup Time [id number]   Final    Colony Count >=100,000 COLONIES/ML   Final    Culture KLEBSIELLA PNEUMONIAE   Final    Report Status 05/11/2011 FINAL   Final    Organism ID, Bacteria KLEBSIELLA PNEUMONIAE   Final     Studies/Results: No results found.  Medications: Scheduled Meds:   .  acetaminophen  500 mg Oral TID  . ampicillin  250 mg Oral Q8H  . carbidopa-levodopa  1 tablet Oral TID  . cholecalciferol  400 Units Oral BID  . docusate sodium  100 mg Oral BID  . enoxaparin (LOVENOX) injection  40 mg Subcutaneous Q24H  . ferrous gluconate  324 mg Oral BID  . levothyroxine  100 mcg Oral QAM  . loratadine  10 mg Oral QPM  . pantoprazole  40 mg Oral Q1200  . polyethylene glycol  17 g Oral Daily  . predniSONE  2.5 mg Oral QPM  . sertraline  25 mg Oral QPM  . warfarin  10 mg Oral Once   Continuous Infusions:  PRN  Meds:.albuterol, albuterol, alum & mag hydroxide-simeth, guaiFENesin-dextromethorphan, HYDROcodone-acetaminophen, ipratropium, ketorolac, metoCLOPramide (REGLAN) injection, metoCLOPramide, ondansetron (ZOFRAN) IV, ondansetron, polyethylene glycol, senna-docusate  Assessment/Plan:  Principal Problem: 1 *Hip fracture, right: Very slow progression. Continues to need assist by two. Pain managed.  Will be going to facility Active Problems: 2. Encounter for long-term (current) use of anticoagulants: per pharmacy 3. PE (pulmonary embolism) INR 1.48 4. Hypothyroidism cont synthroid 5. COPD (chronic obstructive pulmonary disease)At baseline.  6. Dysphagia tolerating regular diet 7 Parkinson's disease stable 8. Fall see #1 9. UTI (urinary tract infection) antibiotics day #5    LOS: 5 days   [facility] M 05/13/2011, 10:54 AM  I have seen and examined the patient. Agree with the note and plan as outlined by Ms. Toya Smothers. Patient doing well, alert, comfortable, eating lunch, son at bed-side. DC to SNF today with ambulance, keep foley cath per patient's request. Continue TyelnolES TID and vicodin PRN for severe pain. Add bowel regimen, lovenox until coumadin therapeutic.  Maciel Kegg, MD 05/13/2011 12:23 PM

## 2011-05-13 NOTE — Discharge Summary (Signed)
Physician Discharge Summary  Patient ID: CARIS CERVENY MRN: [id number] DOB/AGE: [birth year] 75 y.o.  Admit date: 05/08/2011 Discharge date: 05/13/2011  Primary Care Physician:  No primary provider on file.   Discharge Diagnoses:    Present on Admission:  .Hip fracture, right  Current Discharge Medication List    START taking these medications   Details  ampicillin (PRINCIPEN) 250 MG capsule Take 1 capsule (250 mg total) by mouth every 8 (eight) hours. Qty: 12 capsule, Refills: 0    bisacodyl (BISACODYL) 5 MG EC tablet Take 1 tablet (5 mg total) by mouth daily as needed for constipation. Qty: 30 tablet, Refills: 0    enoxaparin (LOVENOX) 40 MG/0.4ML SOLN Inject 0.4 mLs (40 mg total) into the skin daily. Inject 0.4 mLs (40 mg total) into the skin daily. Do NOT expel air bubble from syringe before giving. Continue until INR  Greater than or equal to 2.0 then stop. Qty: 11.2 mL, Refills: 0    !! ipratropium (ATROVENT) 0.02 % nebulizer solution Take 2.5 mLs (0.5 mg total) by nebulization every 6 (six) hours as needed. Qty: 75 mL, Refills: 0     !! - Potential duplicate medications found. Please discuss with provider.    CONTINUE these medications which have CHANGED   Details  acetaminophen (TYLENOL) 500 MG tablet Take 1 tablet (500 mg total) by mouth 3 (three) times daily. Qty: 30 tablet, Refills: 0    sennosides-docusate sodium (SENOKOT-S) 8.6-50 MG tablet Take 2 tablets by mouth at bedtime. Qty: 30 tablet, Refills: 0    warfarin (COUMADIN) 10 MG tablet Have PT/INR drawn 11/16. Have pharmacy dose based on INR. Goal Between 2-3 Qty: 1 tablet, Refills: 0      CONTINUE these medications which have NOT CHANGED   Details  albuterol (PROVENTIL HFA;VENTOLIN HFA) 108 (90 BASE) MCG/ACT inhaler Inhale 2 puffs into the lungs every 4 (four) hours as needed. For shortness of breath.     benzocaine (ORAJEL) 10 % mucosal gel Use as directed 1 application in the mouth or throat 4  (four) times daily as needed. Applies to sore tongue and other areas.     carbidopa-levodopa (SINEMET) 25-100 MG per tablet Take 1 tablet by mouth 3 (three) times daily.      cholecalciferol (VITAMIN D) 400 UNITS TABS Take 400 Units by mouth 2 (two) times daily.      EPINEPHrine (EPI-PEN) 0.3 mg/0.3 mL DEVI Inject 0.3 mg into the muscle as needed. For allergic reactions.     ferrous gluconate (FERGON) 324 MG tablet Take 324 mg by mouth 2 (two) times daily.     Glucosamine-Chondroitin (GLUCOSAMINE CHONDR COMPLEX) 500-400 MG CAPS Take 2 capsules by mouth every evening.      !! ipratropium (ATROVENT) 0.02 % nebulizer solution Take 500 mcg by nebulization 2 (two) times daily.     levothyroxine (SYNTHROID, LEVOTHROID) 100 MCG tablet Take 100 mcg by mouth every morning.      loratadine (CLARITIN) 10 MG tablet Take 10 mg by mouth every evening.      omeprazole (PRILOSEC) 20 MG capsule Take 20 mg by mouth 2 (two) times daily.      predniSONE (DELTASONE) 2.5 MG tablet Take 2.5 mg by mouth every evening. Take with food.     PRESCRIPTION MEDICATION Inject 0.1 mLs into the muscle once a week. Allergy Injections per protocol. She takes on Thursdays.     pseudoephedrine (SUDOGEST) 30 MG tablet Take 30 mg by mouth 2 (two) times daily as needed.  For congestion.     sertraline (ZOLOFT) 25 MG tablet Take 25 mg by mouth every evening.      zolpidem (AMBIEN) 5 MG tablet Take 2.5 mg by mouth at bedtime.       !! - Potential duplicate medications found. Please discuss with provider.       Disposition and Follow-up: Pt medically stable and ready for discharge to skilled facility  Consults:  Dr. Carola Frost, Orthopedic surg   Significant Diagnostic Studies:  Dg Chest 1 View  05/08/2011  *RADIOLOGY REPORT*  Clinical Data: Fall, right hip deformity  CHEST - 1 VIEW  Comparison: 10/11/2009  Findings: Chronic interstitial markings.  Left basilar atelectasis / scarring. No pleural effusion or pneumothorax.   The heart is top normal in size.  Prior lower thoracolumbar vertebral augmentation.  IMPRESSION: No evidence of acute cardiopulmonary disease.  Original Report Authenticated By: Charline Bills, M.D.   Dg Hip Complete Right  05/08/2011  *RADIOLOGY REPORT*  Clinical Data: Fall, right hip deformity  RIGHT HIP - COMPLETE 2+ VIEW  Comparison: None.  Findings: Mildly comminuted right femoral neck fracture with foreshortening/varus deformity.  No additional fractures are seen.  Prior vertebral augmentation of the lower lumbar spine.  IMPRESSION: Comminuted right femoral neck fracture with foreshortening/varus deformity.  Original Report Authenticated By: Charline Bills, M.D.   Dg Knee 1-2 Views Right  05/08/2011  *RADIOLOGY REPORT*  Clinical Data: Fall  RIGHT KNEE - 1-2 VIEW  Comparison: None.  Findings: No fracture or dislocation is seen.  Tricompartmental degenerative changes.  The visualized soft tissues are unremarkable.  No suprapatellar knee joint effusion.  IMPRESSION: No fracture or dislocation is seen.  Tricompartmental degenerative changes.  Original Report Authenticated By: Charline Bills, M.D.    Labs Reviewed  CBC - Abnormal; Notable for the following:    WBC 18.0 (*)    RDW 25.7 (*)    All other components within normal limits  DIFFERENTIAL - Abnormal; Notable for the following:    Neutrophils Relative 89 (*)    Lymphocytes Relative 6 (*)    Neutro Abs 16.0 (*)    All other components within normal limits  BASIC METABOLIC PANEL - Abnormal; Notable for the following:    Glucose, Bld 124 (*)    GFR calc non Af Amer 44 (*)    GFR calc Af Amer 51 (*)    All other components within normal limits  PROTIME-INR - Abnormal; Notable for the following:    Prothrombin Time 23.3 (*)    INR 2.03 (*)    All other components within normal limits  URINALYSIS, ROUTINE W REFLEX MICROSCOPIC - Abnormal; Notable for the following:    Hgb urine dipstick LARGE (*)    Leukocytes, UA TRACE (*)     All other components within normal limits  URINE MICROSCOPIC-ADD ON - Abnormal; Notable for the following:    Bacteria, UA MANY (*)    All other components within normal limits  BASIC METABOLIC PANEL - Abnormal; Notable for the following:    Glucose, Bld 114 (*)    GFR calc non Af Amer 45 (*)    GFR calc Af Amer 52 (*)    All other components within normal limits  CBC - Abnormal; Notable for the following:    WBC 11.8 (*)    RDW 26.0 (*)    All other components within normal limits  PROTIME-INR - Abnormal; Notable for the following:    Prothrombin Time 17.8 (*)    All other  components within normal limits  CBC - Abnormal; Notable for the following:    RBC 3.78 (*)    Hemoglobin 10.1 (*)    HCT 32.6 (*)    RDW 25.8 (*) REPEATED TO VERIFY   All other components within normal limits  BASIC METABOLIC PANEL - Abnormal; Notable for the following:    Glucose, Bld 133 (*)    GFR calc non Af Amer 45 (*)    GFR calc Af Amer 52 (*)    All other components within normal limits  PROTIME-INR - Abnormal; Notable for the following:    Prothrombin Time 15.6 (*)    All other components within normal limits  CBC - Abnormal; Notable for the following:    RBC 3.64 (*)    Hemoglobin 9.7 (*)    HCT 31.6 (*)    All other components within normal limits  BASIC METABOLIC PANEL - Abnormal; Notable for the following:    GFR calc non Af Amer 45 (*)    GFR calc Af Amer 52 (*)    All other components within normal limits  CBC - Abnormal; Notable for the following:    RBC 3.64 (*)    Hemoglobin 9.5 (*)    HCT 32.3 (*)    MCHC 29.4 (*)    All other components within normal limits  BASIC METABOLIC PANEL - Abnormal; Notable for the following:    GFR calc non Af Amer 47 (*)    GFR calc Af Amer 55 (*)    All other components within normal limits  CBC - Abnormal; Notable for the following:    RBC 3.41 (*)    Hemoglobin 8.9 (*)    HCT 30.2 (*)    MCHC 29.5 (*)    All other components within normal  limits  PROTIME-INR - Abnormal; Notable for the following:    Prothrombin Time 18.2 (*)    All other components within normal limits  APTT  URINE CULTURE  TSH  TYPE AND SCREEN  ABO/RH  PROTIME-INR  PROTIME-INR     Procedure(s): ARTHROPLASTY BIPOLAR HIP   Dg Chest 1 View  05/08/2011  *RADIOLOGY REPORT*  Clinical Data: Fall, right hip deformity  CHEST - 1 VIEW  Comparison: 10/11/2009  Findings: Chronic interstitial markings.  Left basilar atelectasis / scarring. No pleural effusion or pneumothorax.  The heart is top normal in size.  Prior lower thoracolumbar vertebral augmentation.  IMPRESSION: No evidence of acute cardiopulmonary disease.  Original Report Authenticated By: Charline Bills, M.D.   Dg Hip Complete Right  05/09/2011  *RADIOLOGY REPORT*  Clinical Data: Right hip fracture.  RIGHT HIP - COMPLETE 2+ VIEW  Comparison: 05/08/2011  Findings: The patient has undergone a right hip hemiarthroplasty. The prosthesis appears in good position.  No fractures.  IMPRESSION: Satisfactory appearance of the right hip after right hemiarthroplasty.  Original Report Authenticated By: Gwynn Burly, M.D.   Dg Hip Complete Right  05/08/2011  *RADIOLOGY REPORT*  Clinical Data: Fall, right hip deformity  RIGHT HIP - COMPLETE 2+ VIEW  Comparison: None.  Findings: Mildly comminuted right femoral neck fracture with foreshortening/varus deformity.  No additional fractures are seen.  Prior vertebral augmentation of the lower lumbar spine.  IMPRESSION: Comminuted right femoral neck fracture with foreshortening/varus deformity.  Original Report Authenticated By: Charline Bills, M.D.   Dg Knee 1-2 Views Right  05/08/2011  *RADIOLOGY REPORT*  Clinical Data: Fall  RIGHT KNEE - 1-2 VIEW  Comparison: None.  Findings: No fracture or dislocation  is seen.  Tricompartmental degenerative changes.  The visualized soft tissues are unremarkable.  No suprapatellar knee joint effusion.  IMPRESSION: No fracture or  dislocation is seen.  Tricompartmental degenerative changes.  Original Report Authenticated By: Charline Bills, M.D.       Brief H and P: For complete details please refer to admission H and P, but in brief Patient is 77 year old Caucasian female with multiple medical problems including history of pulmonary embolism on Coumadin, history of COPD, history of hypothyroidism, history of depression, Parkinson's disease, generalized debility, arthritis, GERD, history of questionable dysphagia presented to the [facility] with mechanical fall today. Patient is a resident of skilled nursing facility. History was obtained from the daughter and the patient. Per the patient, she had the first fall today while coming from the bathroom. Patient states that she was assisted by the nursing facility staff, however, she fell back again. She was assisted again by the nursing facility staff and patient was being brought to her recliner, then her knees buckled and she fell again. She denied any nausea, vomiting, any dizziness or any syncopal episode. patient did not lose any consciousness today while having mechanical falls. She denied any chest pain, shortness of breath or any palpitations.    Hospital Course:  No resolved problems to display.  Active Hospital Problems  Diagnoses Date Noted   . Hip fracture, right 05/08/2011   . UTI (urinary tract infection) 05/10/2011   . PE (pulmonary embolism) 05/08/2011   . Hypothyroidism 05/08/2011   . COPD (chronic obstructive pulmonary disease) 05/08/2011   . Dysphagia 05/08/2011   . Parkinson's disease 05/08/2011   . Fall 05/08/2011   . Encounter for long-term (current) use of anticoagulants      Resolved Hospital Problems  Diagnoses Date Noted Date Resolved    Principal Problem: 1. *Hip fracture, right Post op day 4. PT and pain mngmt. Has progressed somewhat slowly. Will need daily PT at facility. Pain controlled.  Active Problems: 2 Encounter for  long-term (current) use of anticoagulants: per pharmacy.  3 PE (pulmonary embolism) Coumadin per pharmacy. Has had 10mg  on day of discharge. INR 1.4. Will need 10mg  11/16 and to have INR drawn 11/16. Pharmacy at facility to dose. Goal INR 2-3. Continue Lovenox until INR >2.0 2 Hypothyroidism stable. Continue synthroid 3 COPD (chronic obstructive pulmonary disease) Stable  Cont home med 4 Dysphagia: No problem during this hospitalization. Tolerating reg diet 5 Parkinson's disease: stable  6.Fall: See #1  7UTI (urinary tract infection)4 days of iv rocephin changed to ampicillin 11/14. Last dose 11/18   Time spent on Discharge:  Signed: Gwenyth Bender 05/13/2011, 1:15 PM  I have seen and examined the patient. Agree with the note and plan as outlined by Ms. Black, NP.  Thad Ranger 05/13/2011 1:29 PM

## 2011-05-13 NOTE — Progress Notes (Signed)
ANTICOAGULATION CONSULT NOTE - FOLLOW UP  Pharmacy Consult for Warfarin/Lovenox Indication: Hx of PE, now s/p R hip hemi-arthroplasty  Patient Measurements: Height: 5\' 6"  (167.6 cm) Weight: 194 lb 3.6 oz (88.1 kg) IBW/kg (Calculated) : 59.3   Vital Signs: Temp: 97.8 F (36.6 C) (11/15 0502) Temp src: Oral (11/15 0502) BP: 114/71 mmHg (11/15 0502) Pulse Rate: 82  (11/15 0502)  Labs:  Basename 05/13/11 0652 05/13/11 0430 05/12/11 0507 05/11/11 0507  HGB -- 8.9* 9.5* --  HCT -- 30.2* 32.3* 31.6*  PLT -- 173 164 150  APTT -- -- -- --  LABPROT 18.2* -- 14.6 14.3  INR 1.48 -- 1.12 1.09  HEPARINUNFRC -- -- -- --  CREATININE -- -- 1.00 1.04  CKTOTAL -- -- -- --  CKMB -- -- -- --  TROPONINI -- -- -- --   Estimated Creatinine Clearance: 40.1 ml/min (by C-G formula based on Cr of 1).    Medications:  Scheduled:     . acetaminophen  500 mg Oral TID  . ampicillin  250 mg Oral Q8H  . carbidopa-levodopa  1 tablet Oral TID  . cholecalciferol  400 Units Oral BID  . docusate sodium  100 mg Oral BID  . enoxaparin (LOVENOX) injection  40 mg Subcutaneous Q24H  . enoxaparin   Does not apply Once  . ferrous gluconate  324 mg Oral BID  . levothyroxine  100 mcg Oral QAM  . loratadine  10 mg Oral QPM  . pantoprazole  40 mg Oral Q1200  . polyethylene glycol  17 g Oral Daily  . predniSONE  2.5 mg Oral QPM  . sertraline  25 mg Oral QPM  . warfarin  10 mg Oral NOW  . DISCONTD: cefTRIAXone (ROCEPHIN) IV  1 g Intravenous Q24H       Assessment: 77 yo F on chronic coumadin for hx of PE, now POD#4 R hip hemi-arthroplasty due to fall.  Coumadin resumed 11/11, with surprisingly little response (INR 1.48 today) on boosted doses of 10mg  11/13 and 11/14.  Home dose = warfarin 7mg  MWF and 5mg  TuThSS  Goal of Therapy:  INR 2-3   Plan:  1. Give coumadin 10mg  po today before discharge (if she goes) 2. Would check INR tomorrow at SNF to determine dose 3. Would also check CBC tomorrow since  Hg is slowly drifting down post-op 4. Continue Lovenox 40mg  SQ q24h until INR therapeutic - will make sure she gets a dose today before discharge (if she goes)  Clinton Sawyer, Denny Peon R 05/13/2011,8:35 AM

## 2012-10-17 ENCOUNTER — Encounter: Payer: Self-pay | Admitting: Geriatric Medicine

## 2012-10-17 ENCOUNTER — Non-Acute Institutional Stay (SKILLED_NURSING_FACILITY): Payer: Medicare Other | Admitting: Geriatric Medicine

## 2012-10-17 DIAGNOSIS — Z7901 Long term (current) use of anticoagulants: Secondary | ICD-10-CM

## 2012-10-17 DIAGNOSIS — I2699 Other pulmonary embolism without acute cor pulmonale: Secondary | ICD-10-CM

## 2012-10-17 DIAGNOSIS — R609 Edema, unspecified: Secondary | ICD-10-CM

## 2012-10-17 DIAGNOSIS — Z79899 Other long term (current) drug therapy: Secondary | ICD-10-CM

## 2012-10-17 HISTORY — DX: Other long term (current) drug therapy: Z79.899

## 2012-10-17 NOTE — Progress Notes (Signed)
Patient ID: Stacey Mccarthy, female   DOB: March 05, 1916, 79 y.o.   MRN: [id number] Chief Complaint  Patient presents with  . Foot Swelling    HPI: This 79 year old female resident of [facility], skilled nursing section, is evaluated today due to worsening edema of both of her feet. This was note noted apparently if about 3 days ago. She has not exhibited any respiratory distress though does complain of being very tired and just not feeling well. These are not new complaints. Review of nursing record shows some further mild decline in patient's overall activity status; she is only able to operate her electric wheelchair without supervision. She remains extensive assistance with all ADLs.  Allergies Reviewed   Medications Reviewed   Data Reviewed      Lab, [facility], external 03/09/12: WBC 5.7, Hgb 10.1, Hct 30.5, Plt 244 Glu 88, BUN 16, Cr. .95, na 140, K+ 4.1. protein 5.7, Alb 3.4 TSH 12.63 06/15/12 INR 2.47 TSH 6.7 07/13/12 INR 2.97 08/08/12 INR 2.5 TSH 4.04, free T4 1.38 09/05/12 INR 2.79 10/03/12 INR 3.25     4/22 INR 4/02   Review of Systems   DATA OBTAINED: from patient, nurse, medical record. GENERAL: SEE HPI. SKIN: Recent buttock wound has healed MOUTH/THROAT: No mouth or tooth pain. No difficulty chewing or swallowing. Complains of tongue and mouth feeling swollen. RESPIRATORY: Occ  Cough, No  wheezing, MIld SOB CARDIAC: No chest pain, palpitations. Edema. GI: No abdominal pain. No N/V/D or constipation. No heartburn or reflux. Frequent incontinence  GU: Indwelling catheter, last changed 10/13/2012 MUSCULOSKELETAL: Complains general discomfort, specifically has knee pain with any movement NEUROLOGIC: No dizziness, fainting, headache, No recent change in mental status.  PSYCHIATRIC: No anxiety, behavior issue. Sleeps well. Often sleeps during the day  Physical Exam  Filed Vitals:   10/17/12 1136  BP: 102/73  Pulse: 89  Temp: 97.7 F (36.5 C)  Resp: 19    Filed Weights   10/17/12 1136  Weight: 190 lb (86.183 kg)   GENERAL APPEARANCE: No acute distress, appropriately groomed, overweight body habitus. Alert, pleasant, conversant. HEAD: Normocephalic, atraumatic EYES: Conjunctiva/lids clear. Pupils round, reactive.  Mild periorbital and facial edema EARS: External exam WNL, hearing aids in place. Decreased hearing. NOSE: No deformity or discharge. MOUTH/THROAT: Lips w/o lesions. Oral mucosa, tongue moist, w/o lesion. Oropharynx w/o redness or lesions.  NECK: Supple, full ROM. No thyroid tenderness, enlargement or nodule LYMPHATICS: No head, neck or supraclavicular adenopathy RESPIRATORY: Breathing is even, unlabored. Right lungs with a few crackles, left is clear.  CARDIOVASCULAR: Heart RRR. No murmur or extra heart sounds   EDEMA: 2+ bilateral pedal edema, trace edema both lower legs. Mild periorbital edema GASTROINTESTINAL: Abdomen is obese, soft, non-tender, not distended w/ normal bowel sounds. NEUROLOGIC: Oriented to time, place, person. Speech clear. Mlid tremor (not new).  PSYCHIATRIC: Mood and affect appropriate to situation  ASSESSMENT/PLAN  PE (pulmonary embolism) Unchanged, continues supplemental O2 (continuous), c/o SOB with any activity. No chest pain.   Long term (current) use of anticoagulants Most recent INR just above therapeutic range, warfarin dose was reduced. Repeat INR today even higher (4.0). No recent medication or diet change. Pt does report poor appetite, likley protein stores are low. Hold warfarin today, recheck tomorrow along with CMP  Edema Significant increase in bilateral pedal edema over the last several days, mild lower extremity edema as well as periorbital and facial edema as well. Right lung sounds a bit congested. Edema is likely multifactorial; limited activity teen,  possibly mild heart failure. Add daily diuretic, follow electrolytes  Encounter for long-term (current) use of other  medications Patient's medication list reviewed, will discontinue several medications which likely are not providing any added benefit. (Vitamin D, iron supplement, glucosamine)    Follow up: Routine and as needed  Marieke Lubke T.Verl Kitson, NP-C 10/17/2012

## 2012-10-17 NOTE — Assessment & Plan Note (Signed)
Patient's medication list reviewed, will discontinue several medications which likely are not providing any added benefit. (Vitamin D, iron supplement, glucosamine)

## 2012-10-17 NOTE — Assessment & Plan Note (Signed)
Unchanged, continues supplemental O2 (continuous), c/o SOB with any activity. No chest pain.

## 2012-10-17 NOTE — Assessment & Plan Note (Signed)
Significant increase in bilateral pedal edema over the last several days, mild lower extremity edema as well as periorbital and facial edema as well. Right lung sounds a bit congested. Edema is likely multifactorial; limited activity teen, possibly mild heart failure. Add daily diuretic, follow electrolytes

## 2012-10-17 NOTE — Assessment & Plan Note (Signed)
Most recent INR just above therapeutic range, warfarin dose was reduced. Repeat INR today even higher (4.0). No recent medication or diet change. Pt does report poor appetite, likley protein stores are low. Hold warfarin today, recheck tomorrow along with CMP

## 2012-12-28 ENCOUNTER — Encounter: Payer: Self-pay | Admitting: Geriatric Medicine

## 2012-12-28 ENCOUNTER — Non-Acute Institutional Stay (SKILLED_NURSING_FACILITY): Payer: Medicare Other | Admitting: Geriatric Medicine

## 2012-12-28 DIAGNOSIS — R600 Localized edema: Secondary | ICD-10-CM

## 2012-12-28 DIAGNOSIS — I2699 Other pulmonary embolism without acute cor pulmonale: Secondary | ICD-10-CM

## 2012-12-28 DIAGNOSIS — Z7901 Long term (current) use of anticoagulants: Secondary | ICD-10-CM

## 2012-12-28 DIAGNOSIS — E039 Hypothyroidism, unspecified: Secondary | ICD-10-CM

## 2012-12-28 DIAGNOSIS — R609 Edema, unspecified: Secondary | ICD-10-CM

## 2012-12-28 DIAGNOSIS — M1990 Unspecified osteoarthritis, unspecified site: Secondary | ICD-10-CM

## 2012-12-28 DIAGNOSIS — N760 Acute vaginitis: Secondary | ICD-10-CM

## 2012-12-28 DIAGNOSIS — R5381 Other malaise: Secondary | ICD-10-CM

## 2012-12-28 NOTE — Progress Notes (Signed)
Patient ID: Stacey Mccarthy, female   DOB: [birth year], 79 y.o.   MRN: [id number] Chief Complaint  Patient presents with  . Medical Managment of Chronic Issues    HPI: This is a 79 y.o. female resident of [facility],  Skilled Nursing section evaluated today for management of ongoing medical issues.  Review of record shows no recent acute issues. Patient remains quite debilitated due to advanced age and significant osteoarthritis in her knees. She is only able to operate her electric wheelchair with supervision. She remains extensive assistance with all ADLs.She also has recent worsening of OA in her hands especially her right hand. She continues to have an indwelling urinary catheter,no  sign of infection. Patient does not have any new specific complaints, continues to c/o easy fatigue. She does relate that she has family concerns: her sister who lives in another nursing home fell and broke her leg earlier this week. Recent INR monitoring has been in therapeutic range.   Allergies Reviewed   Medications Reviewed   Data Reviewed      Lab, [facility], external 03/09/12: WBC 5.7, Hgb 10.1, Hct 30.5, Plt 244 Glu 88, BUN 16, Cr. .95, na 140, K+ 4.1. protein 5.7, Alb 3.4 TSH 12.63 06/15/12 INR 2.47 TSH 6.7 07/13/12 INR 2.97 08/08/12 INR 2.5 TSH 4.04, free T4 1.38  09/05/2012 WBC 6.8, hemoglobin 10.4, hematocrit 32.8, platelets 237 10/18/2012 glucose 109, BUN 17, creatinine 1.13, sodium 141, potassium 3.8. LFTs WNL. Albumin 3.2  10/31/2012 11/28/2012 INR 2.46 12/26/2012 INR 2.14    Review of Systems   DATA OBTAINED: from patient, nurse, medical record. GENERAL: SEE HPI. SKIN: Recent buttock wound has healed MOUTH/THROAT: No mouth or tooth pain. No difficulty chewing or swallowing.  len. RESPIRATORY: Occ  Cough, No  wheezing, MIld SOB CARDIAC: No chest pain, palpitations. Edema. GI: No abdominal pain. No N/V/D or constipation. No heartburn or reflux. Frequent  incontinence  GU: Indwelling catheter, last changed 12/11/2012 MUSCULOSKELETAL: Complains pain right third metatarsal head. NEUROLOGIC: No dizziness, fainting, headache, No recent change in mental status.  PSYCHIATRIC: No anxiety, behavior issue. Sleeps well. Often sleeps during the day   Physical Exam  Filed Vitals:   12/28/12 1446  BP: 196/66  Pulse: 81  Temp: 97.8 F (36.6 C)  Resp: 20   Filed Weights   12/28/12 1446  Weight: 184 lb 8 oz (83.689 kg)   GENERAL APPEARANCE: No acute distress, appropriately groomed, overweight body habitus. Alert, pleasant, conversant. HEAD: Normocephalic, atraumatic EYES: Conjunctiva/lids clear. Pupils round, reactive.   EARS: External exam WNL, hearing aids in place. Decreased hearing. NOSE: No deformity or discharge. MOUTH/THROAT: Lips w/o lesions. Oral mucosa, tongue moist, w/o lesion. Oropharynx w/o redness or lesions. MUSCULOSKELETAL: Right third metatarsal head is red and, minimal swelling tender. Stiff painful range of motion. GASTROINTESTINAL: Abdomen is obese, soft, non-tender, not distended w/ normal bowel sounds. NEUROLOGIC: Oriented to time, place, person. Speech clear. Mlid tremor (not new).  PSYCHIATRIC: Mood and affect appropriate to situation  ASSESSMENT/PLAN  Debility, unspecified Unchanged, continues to require extensive assist with all ADLs except eating. Requires mechanical lift for transfers.  PE (pulmonary embolism) No recent signs or symptoms of pulmonary embolism, specifically no chest pain no increased shortness of breath.  Vaginitis and vulvovaginitis, unspecified Indwelling urinary catheter remains in place for patient comfort, changed q month. No recurrence of vaginitis or labial excoriations, no s/s UTI. Last catheter change 12/11/2012  Osteoarthrosis, unspecified whether generalized or localized, unspecified site Knee pain continues, right greater than  left. She is reasonably comfortable as long as she doesn't  have to stand. Continues to tolerate mechanical lift transfers.. complaining of pain and redness the knuckles of her right hand. Recommend trying Voltaren gel to reduce the pain and stiffness.  Edema of both legs Lasix was started  last month for increase in bipedal edema. It is improved, trace on the left foot today. Repeat BNP 01/23/2013  Long term (current) use of anticoagulants Most recent INR in therapeutic range, continue current warfarin dose, next INR 01/23/2013  Hypothyroidism Most recent TSH in normal range, T3 and T4 normal as well. Continue current Synthroid dose, repeat TSH 01/23/2013    Follow up: Routine and as needed  Lab 01/23/13 CBC, BMP, TSH, INR  Jaquawn Saffran T.Emry Tobin, NP-C 12/28/2012

## 2013-01-07 DIAGNOSIS — R1312 Dysphagia, oropharyngeal phase: Secondary | ICD-10-CM | POA: Insufficient documentation

## 2013-01-07 DIAGNOSIS — R600 Localized edema: Secondary | ICD-10-CM | POA: Insufficient documentation

## 2013-01-07 DIAGNOSIS — N760 Acute vaginitis: Secondary | ICD-10-CM | POA: Insufficient documentation

## 2013-01-07 DIAGNOSIS — M1990 Unspecified osteoarthritis, unspecified site: Secondary | ICD-10-CM | POA: Insufficient documentation

## 2013-01-07 DIAGNOSIS — R0902 Hypoxemia: Secondary | ICD-10-CM | POA: Insufficient documentation

## 2013-01-07 NOTE — Assessment & Plan Note (Signed)
Indwelling urinary catheter remains in place for patient comfort, changed q month. No recurrence of vaginitis or labial excoriations, no s/s UTI. Last catheter change 12/11/2012

## 2013-01-07 NOTE — Assessment & Plan Note (Signed)
Knee pain continues, right greater than left. She is reasonably comfortable as long as she doesn't have to stand. Continues to tolerate mechanical lift transfers.. complaining of pain and redness the knuckles of her right hand. Recommend trying Voltaren gel to reduce the pain and stiffness.

## 2013-01-07 NOTE — Assessment & Plan Note (Signed)
No recent signs or symptoms of pulmonary embolism, specifically no chest pain no increased shortness of breath.   

## 2013-01-07 NOTE — Assessment & Plan Note (Signed)
Most recent TSH in normal range, T3 and T4 normal as well. Continue current Synthroid dose, repeat TSH 01/23/2013

## 2013-01-07 NOTE — Assessment & Plan Note (Signed)
Most recent INR in therapeutic range, continue current warfarin dose, next INR 01/23/2013

## 2013-01-07 NOTE — Assessment & Plan Note (Signed)
Unchanged, continues to require extensive assist with all ADLs except eating. Requires mechanical lift for transfers.

## 2013-01-07 NOTE — Assessment & Plan Note (Signed)
Lasix was started  last month for increase in bipedal edema. It is improved, trace on the left foot today. Repeat BNP 01/23/2013

## 2013-01-23 ENCOUNTER — Non-Acute Institutional Stay (SKILLED_NURSING_FACILITY): Payer: Medicare Other | Admitting: Geriatric Medicine

## 2013-01-23 ENCOUNTER — Encounter: Payer: Self-pay | Admitting: Geriatric Medicine

## 2013-01-23 DIAGNOSIS — Z7901 Long term (current) use of anticoagulants: Secondary | ICD-10-CM

## 2013-01-23 DIAGNOSIS — Z66 Do not resuscitate: Secondary | ICD-10-CM

## 2013-01-23 DIAGNOSIS — D500 Iron deficiency anemia secondary to blood loss (chronic): Secondary | ICD-10-CM

## 2013-01-23 NOTE — Assessment & Plan Note (Signed)
INR at upper limit of therapeutic range today, pt. with acute anemia. Will hold warfarin for 2 days, recheck INR 7/31.

## 2013-01-23 NOTE — Assessment & Plan Note (Signed)
Pt. With chronic anemia, most recent H/H have been stable. Today Hgb 6.0. No gross bleeding but suspect GI blood loss. Check stool for occult blood, start Fe supplement , schedule transfusion

## 2013-01-23 NOTE — Progress Notes (Signed)
Patient ID: Stacey Mccarthy, female   DOB: 1916-01-09, 79 y.o.   MRN: [id number] [facility] (31)  Code Status: DNR   Chief Complaint  Patient presents with  . Acute Visit    Anemia    HPI: This is a 79 y.o. female resident of [facility],  Skilled Nursing section.  Evaluation is requested today due to Critical lab result today; Hgb 6.0.   Nursing assessment reveals pt.'s VS are stable, only c/o is being tired. No gross bleeding reported.   Patient reports feeling very tired today 'too tired to eat..". Patient was treated with 3 day course of Naprosyn starting 10 days ago, is chronically anticoagulated with warfarin. Today's INR is at upper limit of therapeutic range.    Allergies  Allergen Reactions  . Banana Other (See Comments)    Reaction unknown  . Cinobac (Cinoxacin) Other (See Comments)    Reaction unknown   . Doxycycline Hyclate Other (See Comments)    Reaction unknown   . Fluconazole In Dextrose Other (See Comments)    Reaction unknown   . Mold Extract (Trichophyton Mentagrophyte) Other (See Comments)    Reaction unknown   . Sulfa Antibiotics Other (See Comments)    Reaction unknown    Medications    Medication List       This list is accurate as of: 01/23/13  2:01 PM.  Always use your most recent med list.               acetaminophen 500 MG tablet  Commonly known as:  TYLENOL  Take 500 mg by mouth 3 (three) times daily.     carbidopa-levodopa 25-100 MG per tablet  Commonly known as:  SINEMET IR  Take 1 tablet by mouth 3 (three) times daily.     chlorhexidine 0.12 % solution  Commonly known as:  PERIDEX  Use as directed 15 mLs in the mouth or throat 2 (two) times daily.     diclofenac sodium 1 % Gel  Commonly known as:  VOLTAREN  Apply 4 g topically 2 (two) times daily. Apply to each knee twice daily     EPINEPHrine 0.3 mg/0.3 mL Devi  Commonly known as:  EPI-PEN  Inject 0.3 mg into the muscle as  needed. For allergic reactions.     ferrous sulfate 325 (65 FE) MG tablet  Take 325 mg by mouth daily with breakfast.     Fluticasone-Salmeterol 250-50 MCG/DOSE Aepb  Commonly known as:  ADVAIR  Inhale 1 puff into the lungs 2 (two) times daily.     furosemide 40 MG tablet  Commonly known as:  LASIX  Take 40 mg by mouth daily.     ipratropium 0.02 % nebulizer solution  Commonly known as:  ATROVENT  Take 500 mcg by nebulization 2 (two) times daily.     levothyroxine 112 MCG tablet  Commonly known as:  SYNTHROID, LEVOTHROID  Take 112 mcg by mouth daily before breakfast.     loratadine 10 MG tablet  Commonly known as:  CLARITIN  Take 10 mg by mouth every evening.     omeprazole 20 MG capsule  Commonly known as:  PRILOSEC  Take 20 mg by mouth 2 (two) times daily.     potassium chloride SA 20 MEQ tablet  Commonly known as:  K-DUR,KLOR-CON  Take 20 mEq by mouth daily.     PRESCRIPTION MEDICATION  Inject 0.1 mLs into the muscle once a week. Allergy Injections per protocol. She  takes on Thursdays.     sennosides-docusate sodium 8.6-50 MG tablet  Commonly known as:  SENOKOT-S  Take 2 tablets by mouth at bedtime.     sertraline 25 MG tablet  Commonly known as:  ZOLOFT  Take 25 mg by mouth every evening.     warfarin 3 MG tablet  Commonly known as:  COUMADIN  Take 3 mg by mouth as directed. Su Tu We Th Sa or as directed for anticoagulation     warfarin 5 MG tablet  Commonly known as:  COUMADIN  Take 5 mg by mouth as directed. Mon,  Fri       DATA REVIEWED  Laboratory Studies:  Lab, [facility], external  03/09/12: WBC 5.7, Hgb 10.1, Hct 30.5, Plt 244   Glu 88, BUN 16, Cr. .95, na 140, K+ 4.1. protein 5.7, Alb 3.4   TSH 12.63  06/15/12 INR 2.47   TSH 6.7   08/08/12  TSH 4.04, free T4 1.38   09/05/2012 WBC 6.8, hemoglobin 10.4, hematocrit 32.8, platelets 237   10/18/2012 Glucose 109, BUN 17, creatinine 1.13, sodium 141, potassium 3.8. LFTs WNL. Albumin 3.2    11/28/2012 INR 2.46  12/26/2012 INR 2.14  01/23/13 WBC 6.4, Hgb 6.0, Hct 20.3, MCV 68.8, MCH 20.3, RDW 17.1, Plt 356  INR 3.06  Glu 92, BUN 19, Cr. 1.16, na 141, K+ 4.3  TSH 6.48  Review of Systems  DATA OBTAINED: from patient, nurse, medical record.  GENERAL:  Feels tired all the time. Not hungry today. No change in weight SKIN: No itch, rash or open wounds EYES: No eye pain, dryness or itching  No change in vision EARS: No earache, tinnitus, change in hearing NOSE: No congestion, drainage or bleeding MOUTH/THROAT: No mouth or tooth pain. No difficulty chewing or swallowing. len.  RESPIRATORY: Occ Cough, No wheezing, MIld SOB  CARDIAC: No chest pain, palpitations. Edema.  GI: No abdominal pain. No N/V/D or constipation. No heartburn or reflux. Frequent incontinence  GU: Indwelling catheter, last changed 01/10/2013  MUSCULOSKELETAL: Less pain/ swelling  right third metatarsal head. Rt. Knee pain (chronic) Non ambulatory NEUROLOGIC: No dizziness, fainting, headache, No recent change in mental status.  PSYCHIATRIC: No anxiety, behavior issue. Sleeps well. Often sleeps during the day  Physical Exam Filed Vitals:   01/23/13 1356  BP: 107/61  Pulse: 97  Temp: 97.2 F (36.2 C)  Resp: 26  SpO2: 92%    GENERAL APPEARANCE: No acute distress, appropriately groomed, overweight body habitus. Alert, pleasant, conversant.  HEAD: Normocephalic, atraumatic  EYES: Conjunctiva/lids clear, conjunctiva very pale. Pupils round, reactive.  EARS: External exam WNL, hearing aids in place. Decreased hearing.  NOSE: No deformity or discharge.  MOUTH/THROAT: Lips w/o lesions. Oral mucosa, tongue moist, w/o lesion. Oropharynx w/o redness or lesions.  RESPIRATORY: Breathing is even, unlabored. Lung sounds are clear and full.  CARDIOVASCULAR: Heart RRR. No murmur or extra heart sounds  EDEMA: Trace peripheral edema.  MUSCULOSKELETAL: wearing 'arthritis glove' on rt. hand GASTROINTESTINAL: Abdomen  is obese, soft, non-tender, not distended w/ normal bowel sounds.  NEUROLOGIC: Oriented to time, place, person. Speech clear. Mlid tremor (not new).  PSYCHIATRIC: Mood and affect appropriate to situation  ASSESSMENT/PLAN  Long term (current) use of anticoagulants INR at upper limit of therapeutic range today, pt. with acute anemia. Will hold warfarin for 2 days, recheck INR 7/31.  Iron deficiency anemia secondary to blood loss (chronic) Pt. With chronic anemia, most recent H/H have been stable. Today Hgb 6.0. No gross bleeding  but suspect GI blood loss. Check stool for occult blood, start Fe supplement , schedule transfusion   Follow up: As needed  Normalee Sistare T.Joanthony Hamza, NP-C 01/23/2013

## 2013-01-24 ENCOUNTER — Other Ambulatory Visit (HOSPITAL_COMMUNITY): Payer: Self-pay | Admitting: *Deleted

## 2013-01-25 ENCOUNTER — Encounter (HOSPITAL_COMMUNITY)
Admission: RE | Admit: 2013-01-25 | Discharge: 2013-01-25 | Disposition: A | Discharge status: Home / Self Care | Payer: Medicare Other | Source: Ambulatory Visit | Attending: Geriatric Medicine | Admitting: Geriatric Medicine

## 2013-01-25 DIAGNOSIS — D509 Iron deficiency anemia, unspecified: Secondary | ICD-10-CM | POA: Insufficient documentation

## 2013-01-25 LAB — ABO/RH: ABO/RH(D): O POS

## 2013-01-25 LAB — PREPARE RBC (CROSSMATCH)

## 2013-01-25 MED ORDER — FUROSEMIDE 10 MG/ML IJ SOLN
40.0000 mg | Freq: Once | INTRAMUSCULAR | Status: AC
  Administered 2013-01-25: 40 mg via INTRAVENOUS
  Filled 2013-01-25 (×2): qty 4

## 2013-01-26 LAB — TYPE AND SCREEN
ABO/RH(D): O POS
Unit division: 0
Unit division: 0

## 2013-01-30 ENCOUNTER — Encounter: Payer: Self-pay | Admitting: Internal Medicine

## 2013-02-12 ENCOUNTER — Encounter: Payer: Self-pay | Admitting: Geriatric Medicine

## 2013-02-12 ENCOUNTER — Non-Acute Institutional Stay (SKILLED_NURSING_FACILITY): Payer: Medicare Other | Admitting: Geriatric Medicine

## 2013-02-12 DIAGNOSIS — Z7901 Long term (current) use of anticoagulants: Secondary | ICD-10-CM

## 2013-02-12 DIAGNOSIS — F329 Major depressive disorder, single episode, unspecified: Secondary | ICD-10-CM

## 2013-02-12 DIAGNOSIS — I2699 Other pulmonary embolism without acute cor pulmonale: Secondary | ICD-10-CM

## 2013-02-12 DIAGNOSIS — M1990 Unspecified osteoarthritis, unspecified site: Secondary | ICD-10-CM

## 2013-02-12 DIAGNOSIS — F32A Depression, unspecified: Secondary | ICD-10-CM | POA: Insufficient documentation

## 2013-02-12 DIAGNOSIS — D500 Iron deficiency anemia secondary to blood loss (chronic): Secondary | ICD-10-CM

## 2013-02-12 NOTE — Assessment & Plan Note (Signed)
No recent signs or symptoms of pulmonary embolism, specifically no chest pain no increased shortness of breath.

## 2013-02-12 NOTE — Assessment & Plan Note (Signed)
Most recent INR in therapeutic range, continued her warfarin dose, next INR one week

## 2013-02-12 NOTE — Assessment & Plan Note (Signed)
Patient is status post transfusion of packed red blood cells, repeat  CBC improved with hemoglobin greater than 9. Patient's had no sign of active bleeding. She is tolerating daily dose of iron supplement. Next CBC in one week

## 2013-02-12 NOTE — Assessment & Plan Note (Signed)
Knuckles of right hand continued to be very sore, patient now has significantly reduced range of motion. Voltaren gel was not very effective, short course of NSAID did not make a difference either. Patient has been wearing an 'arthritis glove' with some mild reduction in discomfort. Will ask for OT interventions to help reduce pain and maintain range of motion

## 2013-02-12 NOTE — Progress Notes (Signed)
Patient ID: Stacey Mccarthy, female   DOB: [birth year], 79 y.o.   MRN: [id number]   SNF (31)  Code Status:  Contact Information   Name Relation Home Work Mobile   [name]  [phone]         Chief Complaint  Patient presents with  . Depression  . Medical Managment of Chronic Issues    HPI: This is a 79 y.o. female resident of [facility], Skilled Nursing section evaluated today for management of ongoing medical issues.  Patient's son has expressed a concern about worsening depression. No specific symptoms and on chronic sleepiness. Patient has been receiving sertraline 25 mg daily for quite some time.  At last visit patient was noted to have very low hemoglobin, 6.0. She underwent an uneventful blood transfusion as an outpatient. Repeat CBC was improved with hemoglobin greater than 9. Patient has had no sign of active bleeding. She's had no significant change in her level of energy.  Patient continues to complain of significant pain in her right hand, she is noted with reduced range of motion in her fingers of that hand. Warfarin was stopped briefly prior to transfusion, was restarted the day after. Most recent INR in therapeutic range    Allergies  Allergen Reactions  . Banana Other (See Comments)    Reaction unknown  . Cinobac [Cinoxacin] Other (See Comments)    Reaction unknown   . Doxycycline Hyclate Other (See Comments)    Reaction unknown   . Fluconazole In Dextrose Other (See Comments)    Reaction unknown   . Mold Extract [Trichophyton Mentagrophyte] Other (See Comments)    Reaction unknown   . Sulfa Antibiotics Other (See Comments)    Reaction unknown    Medications Reviewed  DATA REVIEWED  Radiologic Exams:   Cardiovascular Exams:   Laboratory Studies:  [facility] 08/08/12  TSH 4.04, free T4 1.38   09/05/2012 WBC 6.8, hemoglobin 10.4, hematocrit 32.8, platelets 237   10/18/2012 Glucose 109, BUN 17, creatinine 1.13, sodium  141, potassium 3.8. LFTs WNL. Albumin 3.2   11/28/2012 INR 2.46   12/26/2012 INR 2.14  01/23/13 WBC 6.4, Hgb 6.0, Hct 20.3, MCV 68.8, MCH 20.3, RDW 17.1, Plt 356             INR 3.06             Glu 92, BUN 19, Cr. 1.16, na 141, K+ 4.3             TSH 6.48 01/30/2013 WBC 8.6, hemoglobin 9.3, hematocrit 29.7, platelets 314  INR 1.34 02/06/2013 INR 2.19      Review of Systems  DATA OBTAINED: from patient, nurse, medical record.   GENERAL:  Feels tired all the time.  Patient reports not being hungry, Caregiver reports patient had Rueben and cheese cake for lunch No change in weight SKIN: No itch, rash or open wounds EYES: No eye pain, dryness or itching  No change in vision EARS: No earache, tinnitus, change in hearing NOSE: No congestion, drainage or bleeding MOUTH/THROAT: No mouth or tooth pain. No difficulty chewing or swallowing. RESPIRATORY: Occ Cough, No wheezing, MIld SOB  CARDIAC: No chest pain, palpitations. Edema.  GI: No abdominal pain. No N/V/D or constipation. No heartburn or reflux. Frequent incontinence   GU: Indwelling catheter, last changed 01/10/2013   MUSCULOSKELETAL: Right hand very painful, decreased activity Rt. Knee pain (chronic) Non ambulatory NEUROLOGIC: No dizziness, fainting, headache, No recent change in mental status.   PSYCHIATRIC: No anxiety,  behavior issue. Sleeps well. Often sleeps during the day   Physical Exam Filed Vitals:   02/12/13 1411  BP: 115/66  Pulse: 82  Temp: 96.7 F (35.9 C)  Resp: 18  Weight: 184 lb 8 oz (83.689 kg)  SpO2: 94%   Body mass index is 29.79 kg/(m^2). GENERAL APPEARANCE: No acute distress, appropriately groomed, overweight body habitus. Alert, pleasant, conversant.  HEAD: Normocephalic, atraumatic   EYES: Conjunctiva/lids clear,  Pupils round, reactive.  EARS: External exam WNL, hearing aids in place. Decreased hearing.   NOSE: No deformity or discharge.   MOUTH/THROAT: Lips w/o lesions. Oral mucosa, tongue moist,  w/o lesion. Oropharynx w/o redness or lesions.  RESPIRATORY: Breathing is even, unlabored. Lung sounds are clear and full.   CARDIOVASCULAR: Heart RRR. No murmur or extra heart sounds             EDEMA: Trace peripheral edema.  MUSCULOSKELETAL: wearing 'arthritis glove' on rt. Hand. Holds hand and flexed position. Able to extend fingers nearly straight this is painful with third and fourth finger GASTROINTESTINAL: Abdomen is obese, soft, non-tender, not distended w/ normal bowel sounds.   NEUROLOGIC: Oriented to time, place, person. Speech clear. Mlid tremor (not new).   PSYCHIATRIC: Mood and affect appropriate to situation    ASSESSMENT/PLAN  PE (pulmonary embolism) No recent signs or symptoms of pulmonary embolism, specifically no chest pain no increased shortness of breath.    Osteoarthrosis, unspecified whether generalized or localized, unspecified site Knuckles of right hand continued to be very sore, patient now has significantly reduced range of motion. Voltaren gel was not very effective, short course of NSAID did not make a difference either. Patient has been wearing an 'arthritis glove' with some mild reduction in discomfort. Will ask for OT interventions to help reduce pain and maintain range of motion  Long term (current) use of anticoagulants Most recent INR in therapeutic range, continued her warfarin dose, next INR one week  Iron deficiency anemia secondary to blood loss (chronic) Patient is status post transfusion of packed red blood cells, repeat  CBC improved with hemoglobin greater than 9. Patient's had no sign of active bleeding. She is tolerating daily dose of iron supplement. Next CBC in one week  Depression Patient with long-standing history of anxiety and depression. Chief symptom is excess sleepiness, unsure whether this is due to depression or lack of stimulation. Patient's appetite is somewhat decreased by her report though by the record she is eating adequately.  Her weight is stable. Will increase SSRI a small dose and observe.    Follow up: Routine or as needed  Lab 02/20/2013 CBC, INR  Kinzlee Selvy T.Habib Kise, NP-C 02/12/2013

## 2013-02-12 NOTE — Assessment & Plan Note (Signed)
Patient with long-standing history of anxiety and depression. Chief symptom is excess sleepiness, unsure whether this is due to depression or lack of stimulation. Patient's appetite is somewhat decreased by her report though by the record she is eating adequately. Her weight is stable. Will increase SSRI a small dose and observe.

## 2013-03-19 ENCOUNTER — Non-Acute Institutional Stay (SKILLED_NURSING_FACILITY): Payer: Medicare Other | Admitting: Geriatric Medicine

## 2013-03-19 DIAGNOSIS — Z7901 Long term (current) use of anticoagulants: Secondary | ICD-10-CM

## 2013-03-19 DIAGNOSIS — J189 Pneumonia, unspecified organism: Secondary | ICD-10-CM

## 2013-03-23 ENCOUNTER — Encounter: Payer: Self-pay | Admitting: Geriatric Medicine

## 2013-03-23 NOTE — Progress Notes (Signed)
Patient ID: Stacey Mccarthy, female   DOB: 03/06/1916, 79 y.o.   MRN: [id number] [facility] (31)  Code Status: DNR Contact Information   Name Relation Home Work Mobile   [name]  [phone]         Chief Complaint  Patient presents with  . Cough and Lethargy    HPI: This 79 y.o. female resident of [facility]  [facility] is evaluated today in followup of cough and lethargy. The symptoms started September 14, and azithromycin was started on September 15. Her condition improved somewhat, she finished the antibiotic on September 17. On September 21 nurse reports reported increased lethargy, and a chest x-ray was ordered which revealed pneumonitis left lower lobe. On-call provider started p.o. Avelox. Pharmacist expressed concern about use of Avelox with patient's documented Cinebac allergy. Patient has received one dose of Avelox with no adverse effects reported.   INR was checked today do to antibiotic use, level is elevated. Today, nurse reports patient remains very lethargic she's had very poor p.o. intake as well. Congested cough continues.  Patient's son Madilyn Fireman and daughter Meriam Sprague are present at today's visit.    Allergies  Allergen Reactions  . Banana Other (See Comments)    Reaction unknown  . Cinobac [Cinoxacin] Other (See Comments)    Reaction unknown   . Doxycycline Hyclate Other (See Comments)    Reaction unknown   . Fluconazole In Dextrose Other (See Comments)    Reaction unknown   . Mold Extract [Trichophyton Mentagrophyte] Other (See Comments)    Reaction unknown   . Sulfa Antibiotics Other (See Comments)    Reaction unknown    Medications Reviewed  DATA REVIEWED  Radiologic Exams  [facility] X-ray 03/18/2013 chest x-ray: Minimal pneumonitis left lower lobe.  Cardiovascular Exams:   Laboratory Studies  [facility] 01/23/13 WBC 6.4, Hgb 6.0, Hct 20.3, MCV 68.8, MCH 20.3, RDW 17.1,  Plt 356   INR 3.06   Glu 92, BUN 19, Cr. 1.16, na 141, K+ 4.3   TSH 6.48  01/30/2013 WBC 8.6, hemoglobin 9.3, hematocrit 29.7, platelets 314   INR 1.34  02/06/2013 INR 2.19  Review of Systems  DATA OBTAINED: from patient, nurse, medical record, family members GENERAL: Feels Ill.    No fever. Increased fatigue, decreased appetite - see HPI SKIN: No itch, rash or open wounds EYES: No eye pain, dryness or itching  No change in vision EARS: No earache, poor hearing (not new) NOSE: No congestion, drainage or bleeding MOUTH/THROAT: No mouth or tooth pain  No sore throat   No difficulty chewing or swallowing RESPIRATORY: Coughing spells, No wheezing, SOB CARDIAC: No chest pain, palpitations   GI: No abdominal pain  No N/V/D or constipation  No heartburn or reflux  GU: Indwelling urinary catheter, last changed September 17  MUSCULOSKELETAL: Generalized weakness  NEUROLOGIC: No dizziness, fainting, headache,  No change in mental status PSYCHIATRIC: No sign of anxiety, depression  Increased Sleep.    Physical Exam Filed Vitals:   03/19/13 2031  BP: 117/67  Pulse: 82  Temp: 96.7 F (35.9 C)  SpO2: 95%   GENERAL APPEARANCE: No acute distress, appropriately groomed, Overweight body habitus. Lethargic, minimally conversant. SKIN: No diaphoresis, rash, unusual lesions, wounds HEAD: Normocephalic, atraumatic EYES: Conjunctiva/lids clear.  EARS:Decreased Hearing, Aides not in place today NOSE: No deformity or discharge. MOUTH/THROAT: Lips w/o lesions. Oral mucosa, tongue moist, w/o lesion. Oropharynx w/o redness or lesions.  NECK:  No thyroid tenderness, enlargement or  nodule LYMPHATICS: No head, neck or supraclavicular adenopathy RESPIRATORY: Breathing is mildly labored. Congested paroxysmal cough present, nonproductive, chest sounds very congested CARDIOVASCULAR: Heart RRR. No murmur or extra heart sounds GASTROINTESTINAL: Abdomen is soft, non-tender, not distended w/ normal bowel  sounds. GENITOURINARY: Bladder non tender, not distended, draining clear yellow urine via catheter NEUROLOGIC: Not oriented to time, Oriented place, person.  PSYCHIATRIC: Withdrawn, lethargic  ASSESSMENT/PLAN  Pneumonia Patient with congested cough, lethargy, poor PO intake and abnormal chest x-ray. She has been started on a second round of antibiotics, no significant improvement at this time. Lung sounds are very congested may be increased edema, will add several days of diuretic. Paroxysmal cough is draining her energy, add Tussionex for cough suppression. Discuss situation with patient's children, she appears very ill to me and if she is not able to increase her p.o. intake number outcome will be poor. Her current CODE STATUS is DO NOT RESUSCITATE, there are no other advanced directives documented the patient has expressed to her children and to me she is quite tired of her life. Will continue current treatments, close monitoring.  Long term (current) use of anticoagulants INR is elevated after just one dose of Avelox, hold Coumadin, recheck INR 03/22/13   Time: 40 minutes, >50% spent counseling/ care coordination  Follow up: As needed  Zareth Rippetoe T.Sasha Rogel, NP-C 03/23/2013

## 2013-03-25 DIAGNOSIS — J189 Pneumonia, unspecified organism: Secondary | ICD-10-CM | POA: Insufficient documentation

## 2013-03-25 NOTE — Assessment & Plan Note (Signed)
Patient with congested cough, lethargy, poor PO intake and abnormal chest x-ray. She has been started on a second round of antibiotics, no significant improvement at this time. Lung sounds are very congested may be increased edema, will add several days of diuretic. Paroxysmal cough is draining her energy, add Tussionex for cough suppression. Discuss situation with patient's children, she appears very ill to me and if she is not able to increase her p.o. intake number outcome will be poor. Her current CODE STATUS is DO NOT RESUSCITATE, there are no other advanced directives documented the patient has expressed to her children and to me she is quite tired of her life. Will continue current treatments, close monitoring.

## 2013-03-25 NOTE — Assessment & Plan Note (Signed)
INR is elevated after just one dose of Avelox, hold Coumadin, recheck INR 03/22/13

## 2013-05-07 ENCOUNTER — Encounter: Payer: Self-pay | Admitting: Geriatric Medicine

## 2013-05-07 ENCOUNTER — Non-Acute Institutional Stay (SKILLED_NURSING_FACILITY): Payer: Medicare Other | Admitting: Geriatric Medicine

## 2013-05-07 DIAGNOSIS — M24549 Contracture, unspecified hand: Secondary | ICD-10-CM | POA: Insufficient documentation

## 2013-05-07 DIAGNOSIS — M24541 Contracture, right hand: Secondary | ICD-10-CM

## 2013-05-07 DIAGNOSIS — J189 Pneumonia, unspecified organism: Secondary | ICD-10-CM

## 2013-05-07 DIAGNOSIS — D500 Iron deficiency anemia secondary to blood loss (chronic): Secondary | ICD-10-CM

## 2013-05-07 DIAGNOSIS — E039 Hypothyroidism, unspecified: Secondary | ICD-10-CM

## 2013-05-07 DIAGNOSIS — Z7901 Long term (current) use of anticoagulants: Secondary | ICD-10-CM

## 2013-05-07 NOTE — Assessment & Plan Note (Signed)
Patient status post transfusion 8 2014, hemoglobin improved after transfusion. CBC for next month with mild decrease, iron supplement was increased. Repeat CBC this week

## 2013-05-07 NOTE — Progress Notes (Signed)
Patient ID: Stacey Mccarthy, female   DOB: January 24, 1916, 79 y.o.   MRN: [id number] [facility] (31)  Code Status: DNR Contact Information   Name Relation Home Work Mobile   Stacey Mccarthy  [phone]         Chief Complaint  Patient presents with  . Medical Managment of Chronic Issues    HPI: This is a 79 y.o. female resident of [facility]  section is evaluated today for management of ongoing medical issues.  Review of record shows patient's vital signs have been stable, she has had a 10 pound weight loss in the last 3 months;  P.o. Intake fair to poor with some peaked meals patient eating less than 25%.  Patient reports she's just not hungry and is tired of the food.   Last visit:  Pneumonia Patient with congested cough, lethargy, poor PO intake and abnormal chest x-ray. She has been started on a second round of antibiotics, no significant improvement at this time. Lung sounds are very congested may be increased edema, will add several days of diuretic. Paroxysmal cough is draining her energy, add Tussionex for cough suppression. Discuss situation with patient's children, she appears very ill to me and if she is not able to increase her p.o. intake number outcome will be poor. Her current CODE STATUS is DO NOT RESUSCITATE, there are no other advanced directives documented the patient has expressed to her children and to me she is quite tired of her life. Will continue current treatments, close monitoring.  Long term (current) use of anticoagulants INR is elevated after just one dose of Avelox, hold Coumadin, recheck INR 03/22/13  This patient has recovered from the  bout of pneumonia last month; respiratory status is stable, she tolerates fluids very well just is not eating very much food. Activity status remains very limited, she's tells me she sleeps all the time unless she is eating or engaged in conversation.   Most  recent lab showed a slight decrease in her hemoglobin, iron supplement was increased.  Most recent TSH with mild elevation, supplement dose was not changed.  INR with some fluctuations since antibiotic use last month, most recent measure just below therapeutic range, warfarin dose was adjusted  Right finger contractures are unchanged. The patient does continue to attempt to straighten the fingers of her right hand at least twice daily.    Allergies  Allergen Reactions  . Banana Other (See Comments)    Reaction unknown  . Cinobac [Cinoxacin] Other (See Comments)    Reaction unknown   . Doxycycline Hyclate Other (See Comments)    Reaction unknown   . Fluconazole In Dextrose Other (See Comments)    Reaction unknown   . Mold Extract [Trichophyton Mentagrophyte] Other (See Comments)    Reaction unknown   . Sulfa Antibiotics Other (See Comments)    Reaction unknown    Medications Reviewed  DATA REVIEWED  Radiologic Exams  [facility] X-ray 03/18/2013 chest x-ray: Minimal pneumonitis left lower lobe.  Cardiovascular Exams:   Laboratory Studies  [facility] 01/23/13 WBC 6.4, Hgb 6.0, Hct 20.3, MCV 68.8, MCH 20.3, RDW 17.1, Plt 356   INR 3.06   Glu 92, BUN 19, Cr. 1.16, na 141, K+ 4.3   TSH 6.48  01/30/2013 WBC 8.6, hemoglobin 9.3, hematocrit 29.7, platelets 314    02/20/2013 WBC 7.8, hemoglobin 8.6, hematocrit 28.5, platelet 30 to   03/29/2013 INR 1.9  10/13 INR 1.72,  10/30 INR 1.84  Review of Systems  DATA OBTAINED: from patient, nurse, medical record, GENERAL:  Feels "pretty well".  No fever, fair to poor appetite, mild weight loss  SKIN: No itch, rash "feels like a bruise on my upper right arm " EYES: No eye pain, dryness or itching  No change in vision EARS: No earache, poor hearing (not new) NOSE: No congestion, drainage or bleeding MOUTH/THROAT: No mouth or tooth pain  No sore throat   No difficulty chewing or swallowing RESPIRATORY: No cough, No wheezing,  SOB CARDIAC: No chest pain, palpitations   GI: No abdominal pain  No N/V/D or constipation  No heartburn or reflux  GU: Indwelling urinary catheter, last changed October 17  MUSCULOSKELETAL: Decreased mobility right hand   NEUROLOGIC: No dizziness, fainting, headache,  No change in mental status PSYCHIATRIC: No sign of anxiety, depression  Increased Sleep.    Physical Exam Filed Vitals:   05/07/13 1734  BP: 107/61  Pulse: 85  Temp: 96.7 F (35.9 C)  Resp: 16  Weight: 174 lb 6.4 oz (79.107 kg)  SpO2: 97%   GENERAL APPEARANCE: No acute distress, appropriately groomed, Overweight body habitus.  alert, pleasant, conversant.  SKIN: No diaphoresis, rash,  right upper arm with small superficial abrasion  HEAD: Normocephalic, atraumatic EYES: Conjunctiva/lids clear.  EARS:Decreased Hearing, left Aide not in place today ("not worth it") NOSE: No deformity or discharge. MOUTH/THROAT: Lips w/o lesions. Oral mucosa, tongue moist, w/o lesion. Oropharynx w/o redness or lesions.   upper left first molar missing crown  NECK:  No thyroid tenderness, enlargement or nodule LYMPHATICS: No head, neck or supraclavicular adenopathy RESPIRATORY: Breathing is even, nonlabored. Lungs sounds are clear and full throughout  CARDIOVASCULAR: Heart RRR. No murmur or extra heart sounds GASTROINTESTINAL: Abdomen is soft, non-tender, not distended w/ normal bowel sounds. GENITOURINARY: Bladder non tender, not distended, draining clear yellow urine via catheter NEUROLOGIC: Not oriented to time, Oriented place, person.  PSYCHIATRIC:  Mood and affect appropriate to situation   ASSESSMENT/PLAN  Pneumonia The patient has recovered from acute respiratory illness, activity status was very limited prior to the acute illness, it is unchanged. Respiratory status is unchanged from baseline; she continues to require supplemental oxygen  Hypothyroidism Most recent TSH with mild elevation, just above 6. Repeat TSH  Long  term (current) use of anticoagulants INR has been just below therapeutic range for the last several weeks, warfarin dose was increased October 30.  next INR is due November 13  Iron deficiency anemia secondary to blood loss (chronic) Patient status post transfusion 8 2014, hemoglobin improved after transfusion. CBC for next month with mild decrease, iron supplement was increased. Repeat CBC this week  Contracture of finger joint Finger joints and right hand are very stiff due to osteoarthritis. Right third finger middle joint very stiff patient is developing a contracture in his finger; she most often keeps fingers of her hand flexed.  Have recommended a soft rubber ball and or rolled washcloths to be placed in his hand for most of the day to prevent worsening of his contracture.  Can consider OT interventions if patient is willing to be braced    Follow up: As needed  Lab 05/10/2013 CBC, BMP, TSH, INR   Madden Piazza T.Teneka Malmberg, NP-C 05/07/2013

## 2013-05-07 NOTE — Assessment & Plan Note (Signed)
Finger joints and right hand are very stiff due to osteoarthritis. Right third finger middle joint very stiff patient is developing a contracture in his finger; she most often keeps fingers of her hand flexed.  Have recommended a soft rubber ball and or rolled washcloths to be placed in his hand for most of the day to prevent worsening of his contracture.  Can consider OT interventions if patient is willing to be braced

## 2013-05-07 NOTE — Assessment & Plan Note (Signed)
Most recent TSH with mild elevation, just above 6. Repeat TSH

## 2013-05-07 NOTE — Assessment & Plan Note (Signed)
INR has been just below therapeutic range for the last several weeks, warfarin dose was increased October 30.  next INR is due November 13

## 2013-05-07 NOTE — Assessment & Plan Note (Signed)
The patient has recovered from acute respiratory illness, activity status was very limited prior to the acute illness, it is unchanged. Respiratory status is unchanged from baseline; she continues to require supplemental oxygen

## 2013-05-30 LAB — BASIC METABOLIC PANEL WITH GFR
BUN: 21 mg/dL (ref 4–21)
Creatinine: 0.9 mg/dL (ref 0.5–1.1)
Glucose: 99 mg/dL
Potassium: 3.5 mmol/L (ref 3.4–5.3)
Sodium: 137 mmol/L (ref 137–147)

## 2013-05-30 LAB — POCT INR: INR: 3 — AB (ref 0.9–1.1)

## 2013-05-31 ENCOUNTER — Emergency Department (HOSPITAL_COMMUNITY): Payer: Medicare Other

## 2013-05-31 ENCOUNTER — Encounter (HOSPITAL_COMMUNITY): Payer: Self-pay | Admitting: Emergency Medicine

## 2013-05-31 ENCOUNTER — Encounter: Payer: Self-pay | Admitting: Geriatric Medicine

## 2013-05-31 ENCOUNTER — Observation Stay (HOSPITAL_COMMUNITY)
Admission: EM | Admit: 2013-05-31 | Discharge: 2013-06-01 | Disposition: A | Discharge status: Skilled Nursing Facility | Payer: Medicare Other | Attending: Internal Medicine | Admitting: Internal Medicine

## 2013-05-31 ENCOUNTER — Non-Acute Institutional Stay (SKILLED_NURSING_FACILITY): Payer: Medicare Other | Admitting: Geriatric Medicine

## 2013-05-31 DIAGNOSIS — N950 Postmenopausal bleeding: Secondary | ICD-10-CM | POA: Insufficient documentation

## 2013-05-31 DIAGNOSIS — J449 Chronic obstructive pulmonary disease, unspecified: Secondary | ICD-10-CM | POA: Diagnosis present

## 2013-05-31 DIAGNOSIS — J4489 Other specified chronic obstructive pulmonary disease: Secondary | ICD-10-CM | POA: Insufficient documentation

## 2013-05-31 DIAGNOSIS — Z8679 Personal history of other diseases of the circulatory system: Secondary | ICD-10-CM

## 2013-05-31 DIAGNOSIS — E039 Hypothyroidism, unspecified: Secondary | ICD-10-CM | POA: Insufficient documentation

## 2013-05-31 DIAGNOSIS — G20 Parkinson's disease: Secondary | ICD-10-CM | POA: Insufficient documentation

## 2013-05-31 DIAGNOSIS — D649 Anemia, unspecified: Secondary | ICD-10-CM | POA: Diagnosis present

## 2013-05-31 DIAGNOSIS — K6389 Other specified diseases of intestine: Secondary | ICD-10-CM

## 2013-05-31 DIAGNOSIS — C189 Malignant neoplasm of colon, unspecified: Principal | ICD-10-CM | POA: Insufficient documentation

## 2013-05-31 DIAGNOSIS — M810 Age-related osteoporosis without current pathological fracture: Secondary | ICD-10-CM | POA: Insufficient documentation

## 2013-05-31 DIAGNOSIS — Z7901 Long term (current) use of anticoagulants: Secondary | ICD-10-CM

## 2013-05-31 DIAGNOSIS — N824 Other female intestinal-genital tract fistulae: Secondary | ICD-10-CM

## 2013-05-31 DIAGNOSIS — G20A1 Parkinson's disease without dyskinesia, without mention of fluctuations: Secondary | ICD-10-CM | POA: Diagnosis present

## 2013-05-31 DIAGNOSIS — R599 Enlarged lymph nodes, unspecified: Secondary | ICD-10-CM | POA: Insufficient documentation

## 2013-05-31 DIAGNOSIS — N390 Urinary tract infection, site not specified: Secondary | ICD-10-CM | POA: Diagnosis present

## 2013-05-31 DIAGNOSIS — E785 Hyperlipidemia, unspecified: Secondary | ICD-10-CM | POA: Insufficient documentation

## 2013-05-31 DIAGNOSIS — R1900 Intra-abdominal and pelvic swelling, mass and lump, unspecified site: Secondary | ICD-10-CM | POA: Diagnosis present

## 2013-05-31 DIAGNOSIS — I4891 Unspecified atrial fibrillation: Secondary | ICD-10-CM | POA: Insufficient documentation

## 2013-05-31 DIAGNOSIS — N321 Vesicointestinal fistula: Secondary | ICD-10-CM | POA: Diagnosis present

## 2013-05-31 DIAGNOSIS — Z79899 Other long term (current) drug therapy: Secondary | ICD-10-CM

## 2013-05-31 DIAGNOSIS — E876 Hypokalemia: Secondary | ICD-10-CM | POA: Diagnosis present

## 2013-05-31 DIAGNOSIS — Z86711 Personal history of pulmonary embolism: Secondary | ICD-10-CM | POA: Diagnosis present

## 2013-05-31 HISTORY — DX: Malignant neoplasm of colon, unspecified: C18.9

## 2013-05-31 HISTORY — DX: Other specified diseases of intestine: K63.89

## 2013-05-31 HISTORY — DX: Other female intestinal-genital tract fistulae: N82.4

## 2013-05-31 HISTORY — DX: Postmenopausal bleeding: N95.0

## 2013-05-31 LAB — URINALYSIS W MICROSCOPIC + REFLEX CULTURE
Bilirubin Urine: NEGATIVE
Ketones, ur: NEGATIVE mg/dL
Nitrite: NEGATIVE
Specific Gravity, Urine: 1.029 (ref 1.005–1.030)
Urobilinogen, UA: 1 mg/dL (ref 0.0–1.0)

## 2013-05-31 LAB — CBC WITH DIFFERENTIAL/PLATELET
Basophils Absolute: 0 10*3/uL (ref 0.0–0.1)
Basophils Relative: 0 % (ref 0–1)
HCT: 32.1 % — ABNORMAL LOW (ref 36.0–46.0)
MCHC: 30.8 g/dL (ref 30.0–36.0)
Monocytes Absolute: 0.7 10*3/uL (ref 0.1–1.0)
Neutro Abs: 6.5 10*3/uL (ref 1.7–7.7)
Neutrophils Relative %: 74 % (ref 43–77)
Platelets: 402 10*3/uL — ABNORMAL HIGH (ref 150–400)
RDW: 15.8 % — ABNORMAL HIGH (ref 11.5–15.5)

## 2013-05-31 LAB — BASIC METABOLIC PANEL
BUN: 19 mg/dL (ref 4–21)
Creatinine: 1 mg/dL (ref 0.5–1.1)
Glucose: 99 mg/dL
Potassium: 3.6 mmol/L (ref 3.4–5.3)
Sodium: 141 mmol/L (ref 137–147)

## 2013-05-31 LAB — OCCULT BLOOD, POC DEVICE: Fecal Occult Bld: POSITIVE — AB

## 2013-05-31 LAB — COMPREHENSIVE METABOLIC PANEL
ALT: 5 U/L (ref 0–35)
AST: 11 U/L (ref 0–37)
CO2: 29 mEq/L (ref 19–32)
Chloride: 96 mEq/L (ref 96–112)
GFR calc non Af Amer: 54 mL/min — ABNORMAL LOW (ref >90)
Sodium: 136 mEq/L (ref 135–145)
Total Bilirubin: 0.2 mg/dL — ABNORMAL LOW (ref 0.3–1.2)

## 2013-05-31 LAB — POCT INR

## 2013-05-31 MED ORDER — SODIUM CHLORIDE 0.9 % IJ SOLN
3.0000 mL | Freq: Two times a day (BID) | INTRAMUSCULAR | Status: DC
  Administered 2013-06-01: 3 mL via INTRAVENOUS

## 2013-05-31 MED ORDER — DEXTROSE 5 % IV SOLN
1.0000 g | INTRAVENOUS | Status: DC
  Filled 2013-05-31: qty 10

## 2013-05-31 MED ORDER — CHLORHEXIDINE GLUCONATE 0.12 % MT SOLN
15.0000 mL | Freq: Two times a day (BID) | OROMUCOSAL | Status: DC
  Filled 2013-05-31 (×3): qty 15

## 2013-05-31 MED ORDER — POTASSIUM CHLORIDE CRYS ER 20 MEQ PO TBCR
20.0000 meq | EXTENDED_RELEASE_TABLET | Freq: Every day | ORAL | Status: DC
  Filled 2013-05-31: qty 1

## 2013-05-31 MED ORDER — FERROUS SULFATE 325 (65 FE) MG PO TABS
325.0000 mg | ORAL_TABLET | Freq: Two times a day (BID) | ORAL | Status: DC
  Administered 2013-06-01: 325 mg via ORAL
  Filled 2013-05-31 (×3): qty 1

## 2013-05-31 MED ORDER — DEXTROSE 5 % IV SOLN
1.0000 g | Freq: Once | INTRAVENOUS | Status: AC
  Administered 2013-05-31: 1 g via INTRAVENOUS
  Filled 2013-05-31: qty 10

## 2013-05-31 MED ORDER — LEVOTHYROXINE SODIUM 125 MCG PO TABS
125.0000 ug | ORAL_TABLET | Freq: Every day | ORAL | Status: DC
  Administered 2013-06-01: 125 ug via ORAL
  Filled 2013-05-31 (×2): qty 1

## 2013-05-31 MED ORDER — FUROSEMIDE 40 MG PO TABS
40.0000 mg | ORAL_TABLET | Freq: Every day | ORAL | Status: DC
  Administered 2013-06-01: 40 mg via ORAL
  Filled 2013-05-31: qty 1

## 2013-05-31 MED ORDER — IOHEXOL 300 MG/ML  SOLN
100.0000 mL | Freq: Once | INTRAMUSCULAR | Status: AC | PRN
  Administered 2013-05-31: 100 mL via INTRAVENOUS

## 2013-05-31 MED ORDER — SODIUM CHLORIDE 0.9 % IJ SOLN: 3.0000 mL | INTRAMUSCULAR | Status: DC | PRN

## 2013-05-31 MED ORDER — SODIUM CHLORIDE 0.9 % IV SOLN: 250.0000 mL | INTRAVENOUS | Status: DC | PRN

## 2013-05-31 MED ORDER — SERTRALINE HCL 50 MG PO TABS
50.0000 mg | ORAL_TABLET | Freq: Every day | ORAL | Status: DC
  Filled 2013-05-31 (×2): qty 1

## 2013-05-31 MED ORDER — IPRATROPIUM BROMIDE 0.02 % IN SOLN
500.0000 ug | Freq: Two times a day (BID) | RESPIRATORY_TRACT | Status: DC
  Administered 2013-06-01: 500 ug via RESPIRATORY_TRACT
  Filled 2013-05-31: qty 2.5

## 2013-05-31 MED ORDER — LORATADINE 10 MG PO TABS
10.0000 mg | ORAL_TABLET | Freq: Every evening | ORAL | Status: DC
  Filled 2013-05-31: qty 1

## 2013-05-31 MED ORDER — ACETAMINOPHEN 500 MG PO TABS
500.0000 mg | ORAL_TABLET | Freq: Three times a day (TID) | ORAL | Status: DC
  Administered 2013-06-01: 500 mg via ORAL
  Filled 2013-05-31 (×3): qty 1

## 2013-05-31 MED ORDER — CARBIDOPA-LEVODOPA 25-100 MG PO TABS
1.0000 | ORAL_TABLET | Freq: Three times a day (TID) | ORAL | Status: DC
  Administered 2013-06-01: 1 via ORAL
  Filled 2013-05-31 (×4): qty 1

## 2013-05-31 NOTE — ED Notes (Signed)
Bed: WA01 Expected date:  Expected time:  Means of arrival:  Comments: EMS-blood in foley

## 2013-05-31 NOTE — Assessment & Plan Note (Signed)
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 Coumadin was stopped yesterday to 2 bleeding, most recent INR today 3.1. Utility of resuming Coumadin will be determined when cause of bleeding is identified.

## 2013-05-31 NOTE — H&P (Signed)
PCP:   Kimber Relic, MD   Chief Complaint:  hematuria  HPI: 79 yo female sent from snf for concerns for hematuria over last several days.  Her inr yesterday was elevated and coumadin was held for concerns of bleeding.  Sent to ED today for further evaluation.  Pt has had no pain.  No fevers.  She is bedbound since her hip injury a couple of years ago.  Today on pelvic exam she has stool coming out of her vagina.  Pt denies any pain.  dtr is present.  Appetite has been decent.  Ct scan shows probable rectal mass with swollen lymph nodes concerning for malignancy and could not verify a fistula on ct scan.    Review of Systems:  Positive and negative as per HPI otherwise all other systems are negative  Past Medical History: Past Medical History  Diagnosis Date  . Arthritis   . Pulmonary embolism 11/2008  . Debility 10/17/09  . Asthma   . Coagulation defect 03/24/11  . Thyroid disease   . Hypoxemia   . Pneumathemia   . Depression   . Parkinson disease   . Sacroiliitis   . Osteoporosis   . Petechiae   . Dysphagia   . Hyperlipemia   . Neuropathy   . Low vision, one eye, not otherwise specified   . Unspecified essential hypertension   . Other pulmonary embolism and infarction   . Atrial fibrillation   . Heart failure   . Phlebitis and thrombophlebitis of other deep vessels of lower extremities   . Allergic rhinitis due to pollen   . COPD (chronic obstructive pulmonary disease)   . CHF (congestive heart failure)   . Reflux esophagitis   . Inguinal hernia without mention of obstruction or gangrene, unilateral or unspecified, (not specified as recurrent)   . Unspecified constipation   . Osteoarthrosis, unspecified whether generalized or localized, unspecified site   . Pain in joint, lower leg   . Sacroiliitis, not elsewhere classified   . Muscle weakness (generalized)   . Osteoporosis, unspecified   . Pathologic fracture of vertebrae   . Insomnia, unspecified   . Other  malaise and fatigue   . Abnormality of gait   . Rash and other nonspecific skin eruption   . Edema   . Shortness of breath   . Dysphagia, oropharyngeal phase   . Unspecified urinary incontinence   . Nonspecific abnormal electrocardiogram (ECG) (EKG)   . Hypoxemia   . Debility, unspecified   . Closed fracture of dorsal (thoracic) vertebra without mention of spinal cord injury   . Long term (current) use of anticoagulants   . Kyphosis 10/13/09    kyphoplasty  . Rib fractures   . Complication of anesthesia     PNA s/p surgeries  . Hypothyroidism   . Blood transfusion   . GERD (gastroesophageal reflux disease)   . Pneumonia   . Encounter for long-term (current) use of other medications 10/17/2012  . Postmenopausal vaginal bleeding 05/31/2013   Past Surgical History  Procedure Laterality Date  . Tonsillectomy and adenoidectomy    . Breast surgery  1970    Left Benign Breast Bx  . Cataract extraction, bilateral  1992/1995    IOL  . Pressure ulcer debridement      statis ulcer, right leg  . Bladder suspension  2001    AP  . Retinal tear repair cryotherapy    . Plantar fascia surgery    . Nasal sinus surgery    .  Appendectomy  2004  . Back surgery    . Hip arthroplasty  05/09/2011    Procedure: ARTHROPLASTY BIPOLAR HIP;  Surgeon: Budd Palmer;  Location: [facility];  Service: Orthopedics;  Laterality: Right;  Hemi-arthroplasty (Depuy) right    Medications: Prior to Admission medications   Medication Sig Start Date End Date Taking? Authorizing Provider  acetaminophen (TYLENOL) 500 MG tablet Take 500 mg by mouth 3 (three) times daily.   Yes Historical Provider, MD  carbidopa-levodopa (SINEMET) 25-100 MG per tablet Take 1 tablet by mouth 3 (three) times daily.     Yes Historical Provider, MD  chlorhexidine (PERIDEX) 0.12 % solution Use as directed 15 mLs in the mouth or throat 2 (two) times daily.   Yes Historical Provider, MD  diclofenac sodium (VOLTAREN) 1 % GEL Apply 4 g  topically 2 (two) times daily. Apply to each knee twice daily   Yes Historical Provider, MD  EPINEPHrine (EPI-PEN) 0.3 mg/0.3 mL DEVI Inject 0.3 mg into the muscle as needed. For allergic reactions.    Yes Historical Provider, MD  ferrous sulfate 325 (65 FE) MG tablet Take 325 mg by mouth 2 (two) times daily with a meal.  02/20/13  Yes Claudette T Nils Flack, NP  Fluticasone-Salmeterol (ADVAIR) 250-50 MCG/DOSE AEPB Inhale 1 puff into the lungs 2 (two) times daily.   Yes Historical Provider, MD  furosemide (LASIX) 40 MG tablet Take 40 mg by mouth daily.   Yes Historical Provider, MD  ipratropium (ATROVENT) 0.02 % nebulizer solution Take 500 mcg by nebulization 2 (two) times daily.    Yes Historical Provider, MD  levothyroxine (SYNTHROID, LEVOTHROID) 125 MCG tablet Take 125 mcg by mouth daily before breakfast. 05/11/13  Yes Historical Provider, MD  loratadine (CLARITIN) 10 MG tablet Take 10 mg by mouth every evening.     Yes Historical Provider, MD  omeprazole (PRILOSEC) 20 MG capsule Take 20 mg by mouth daily.    Yes Historical Provider, MD  potassium chloride SA (K-DUR,KLOR-CON) 20 MEQ tablet Take 20 mEq by mouth daily.   Yes Historical Provider, MD  PRESCRIPTION MEDICATION Inject 0.1 mLs into the muscle once a week. Allergy Injections per protocol. She takes on Saturdays   Yes Historical Provider, MD  sennosides-docusate sodium (SENOKOT-S) 8.6-50 MG tablet Take 2 tablets by mouth at bedtime. 05/13/11  Yes Lesle Chris Black, NP  sertraline (ZOLOFT) 25 MG tablet Take 50 mg by mouth at bedtime.    Yes Historical Provider, MD    Allergies:   Allergies  Allergen Reactions  . Banana Other (See Comments)    Reaction unknown  . Cinobac [Cinoxacin] Other (See Comments)    Reaction unknown   . Doxycycline Hyclate Other (See Comments)    Reaction unknown   . Fluconazole In Dextrose Other (See Comments)    Reaction unknown   . Mold Extract [Trichophyton Mentagrophyte] Other (See Comments)    Reaction  unknown   . Sulfa Antibiotics Other (See Comments)    Reaction unknown     Social History:  reports that she quit smoking about 37 years ago. She does not have any smokeless tobacco history on file. She reports that she drinks about 0.6 ounces of alcohol per week. She reports that she does not use illicit drugs.  Family History: none  Physical Exam: Filed Vitals:   05/31/13 1632  BP: 131/66  Pulse: 92  Temp: 98 F (36.7 C)  TempSrc: Oral  Resp: 22  SpO2: 97%   General appearance: alert, cooperative and  no distress Head: Normocephalic, without obvious abnormality, atraumatic Eyes: negative Nose: Nares normal. Septum midline. Mucosa normal. No drainage or sinus tenderness. Neck: no JVD and supple, symmetrical, trachea midline Lungs: clear to auscultation bilaterally Heart: regular rate and rhythm, S1, S2 normal, no murmur, click, rub or gallop Abdomen: soft, non-tender; bowel sounds normal; no masses,  no organomegaly Extremities: extremities normal, atraumatic, no cyanosis or edema Pulses: 2+ and symmetric Skin: Skin color, texture, turgor normal. No rashes or lesions Neurologic: Grossly normal  Labs on Admission:   Recent Labs  05/31/13 05/31/13 1727  NA 141 136  K 3.6 3.2*  CL  --  96  CO2  --  29  GLUCOSE  --  99  BUN 19 20  CREATININE 1.0 0.89  CALCIUM  --  9.1    Recent Labs  05/31/13 1727  AST 11  ALT 5  ALKPHOS 80  BILITOT 0.2*  PROT 6.8  ALBUMIN 2.7*    Recent Labs  05/31/13 1727  LIPASE 21    Recent Labs  05/31/13 05/31/13 1730  WBC 5.9 8.8  NEUTROABS  --  6.5  HGB 8.9* 9.9*  HCT 28* 32.1*  MCV  --  87.7  PLT 368 402*    Radiological Exams on Admission: Ct Abdomen Pelvis W Contrast  05/31/2013   CLINICAL DATA:  Vaginal discharge of melanotic liquid. Evaluate for fistula.  EXAM: CT ABDOMEN AND PELVIS WITH CONTRAST  TECHNIQUE: Multidetector CT imaging of the abdomen and pelvis was performed using the standard protocol following  bolus administration of intravenous contrast.  CONTRAST:  OMNIPAQUE IOHEXOL 300 MG/ML  SOLN  COMPARISON:  None  FINDINGS: Lower Chest: Bibasilar interstitial thickening which is nonspecific in this age group. Mild right base scarring. Cardiomegaly, without pericardial or pleural effusion. A small hiatal hernia.  Abdomen/Pelvis: Mild hepatic steatosis, without focal liver lesion. 12 mm low-density splenic lesion on image 21 is nonspecific.  Underdistended proximal stomach. A duodenal diverticulum. Normal pancreas, gallbladder, biliary tract, adrenal glands. Moderate bilateral renal atrophy, greater on the right. Too small to characterize left renal lesions, likely cysts.  No retroperitoneal or retrocrural adenopathy.  Moderately degraded evaluation of the pelvis secondary to beam hardening artifact from right hip arthroplasty. Wall thickening within the sigmoid colon on image 60/series 2. An adjacent pericolonic nodule versus node of 1.7 cm on image 56/series 2.  Extensive colonic diverticulosis. Ascending colonic wall focal moderate to marked thickening just inferior to the hepatic flexure (image 38/series 2) and just above the ileocecal junction on image 45/ series 2. These may be contiguous or adjacent, when correlated with coronal reformatted images. Neither area is obstructive. There is adjacent moderate to marked adenopathy in the ileocolic mesentery. An index node measures 1.9 cm on image 49.  No small bowel obstruction. Small bowel is positioned within a a right lateral pelvic wall hernia.  Omental/peritoneal disease, including in omental nodule of 1.5 cm on image 59 image 55/series 2. No ascites.  A Foley catheter is identified within the urinary bladder. The sigmoid soft tissue thickening is adjacent to the vaginal cuff (presuming prior hysterectomy). This area is extremely degraded due to beam hardening artifact. No gross free pelvic fluid or adnexal mass.  Bones/Musculoskeletal: Moderate to marked  osteopenia. Vertebral augmentation at L4, L5, and T12. Mild T11 compression deformity.  IMPRESSION: 1. Evaluation of the pelvis is moderate to severely degraded secondary to beam hardening artifact from right hip arthroplasty. Vaginal fistula cannot be excluded/evaluated. 2. There is however, ascending  and sigmoid colonic marked wall thickening. The ascending colonic wall thickening is favored to represent primary colon carcinoma. Lymphoma would be an alternate consideration, given absence of obstruction in the extent of adjacent adenopathy. Sigmoid wall thickening is favored to represent a synchronous carcinoma. Serosal based metastasis from the presumed ascending primary would be an alternate consideration. Given the extent of sigmoid wall thickening, and the position of the sigmoid immediately adjacent to the vaginal cuff, this is likely the site of the presumed fistula by clinical findings. 3. Nodal and omental/peritoneal metastasis throughout the abdomen. 4. Nonspecific low-density splenic lesion. 5. Nonobstructive small bowel containing right pelvic wall hernia.   Electronically Signed   By: Jeronimo Greaves M.D.   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  She knows she does not want any aggressive treatment or surgery.  However her and her dtr may want to find out what type of cancer or if cancer.    Active Problems:   Long term (current) use of anticoagulants-  Cont to hold coumadin   Parkinson's disease stable   UTI (lower urinary tract infection) rocephin   Abdominal mass  As above  Pt is DNR, no cpr or intubation in future.  obs overnight until decision about further w/u decided by pt.  If no further w/u may be good candidate for hospice in the near future.      Isiah Scheel A 05/31/2013,  10:41 PM

## 2013-05-31 NOTE — ED Notes (Signed)
Positive Hemoccult reported to [employer]

## 2013-05-31 NOTE — Progress Notes (Signed)
Patient ID: Stacey Mccarthy, female   DOB: 10-05-1915, 79 y.o.   MRN: [id number] [facility] (31)  Code Status: DNR Contact Information   Name Relation Home Work Mobile   [name]  [phone]         Chief Complaint  Patient presents with  . Rectal Bleeding    HPI: This is a 79 y.o. female resident of [facility]  section is evaluated today due to rectal bleeding. I received a phone report yesterday the patient had bleeding with a bowel movement followed by some obvious clots. CBC and BMP were sent, the patient's Coumadin was stopped. Hemoglobin and hematocrit came back slightly improved from prior level, INR was at the top of therapeutic range at 3.0. Patient had no further bleeding overnight. This morning patient was at her usual level of alertness and activity; she is at baseline quite debilitated. Repeat CBC and BMP were drawn. Early this afternoon patient had another bowel movement and it was reported that she had more rectal bleeding. Laboratory this afternoon with mildly decreased hemoglobin hematocrit, INR was about the same at 3.1. BUN and creatinine were stable.  Last visit:  Pneumonia The patient has recovered from acute respiratory illness, activity status was very limited prior to the acute illness, it is unchanged. Respiratory status is unchanged from baseline; she continues to require supplemental oxygen  Hypothyroidism Most recent TSH with mild elevation, just above 6. Repeat TSH  Long term (current) use of anticoagulants INR has been just below therapeutic range for the last several weeks, warfarin dose was increased October 30.  next INR is due November 13  Iron deficiency anemia secondary to blood loss (chronic) Patient status post transfusion 8 2014, hemoglobin improved after transfusion. CBC for next month with mild decrease, iron supplement was increased. Repeat CBC this week  Contracture  of finger joint Finger joints and right hand are very stiff due to osteoarthritis. Right third finger middle joint very stiff patient is developing a contracture in his finger; she most often keeps fingers of her hand flexed.  Have recommended a soft rubber ball and or rolled washcloths to be placed in his hand for most of the day to prevent worsening of his contracture.  Can consider OT interventions if patient is willing to be braced    Allergies  Allergen Reactions  . Banana Other (See Comments)    Reaction unknown  . Cinobac [Cinoxacin] Other (See Comments)    Reaction unknown   . Doxycycline Hyclate Other (See Comments)    Reaction unknown   . Fluconazole In Dextrose Other (See Comments)    Reaction unknown   . Mold Extract [Trichophyton Mentagrophyte] Other (See Comments)    Reaction unknown   . Sulfa Antibiotics Other (See Comments)    Reaction unknown    Medications Reviewed  DATA REVIEWED  Radiologic Exams  [facility] X-ray 03/18/2013 chest x-ray: Minimal pneumonitis left lower lobe.  Cardiovascular Exams:   Laboratory Studies  [facility] 01/23/13 WBC 6.4, Hgb 6.0, Hct 20.3, MCV 68.8, MCH 20.3, RDW 17.1, Plt 356   INR 3.06   Glu 92, BUN 19, Cr. 1.16, na 141, K+ 4.3   TSH 6.48  01/30/2013 WBC 8.6, hemoglobin 9.3, hematocrit 29.7, platelets 314    02/20/2013 WBC 7.8, hemoglobin 8.6, hematocrit 28.5, platelet 30 to   03/29/2013 INR 1.9  10/13 INR 1.72,  10/30 INR 1.84  05/10/2013 WBC 5.9, hemoglobin 8.7, hematocrit 26.8, platelets 223  INR 2.30  Glucose 90, BUN 18, creatinine 0.89, sodium 140, potassium 4.0  TSH level 11.14    Lab Results  Component Value Date   WBC 8.1 05/30/2013   HGB 9.4* 05/30/2013   HCT 29* 05/30/2013   PLT 358 05/30/2013        GLUCOSE 99    NA 137 05/30/2013   K 3.5 05/30/2013   CREATININE 0.9 05/30/2013   BUN 21 05/30/2013   INR 3.0* 05/30/2013   Lab Results  Component Value Date   WBC 5.9 05/31/2013   HGB 8.9* 05/31/2013    HCT 28* 05/31/2013   PLT 368 05/31/2013        GLUCOSE 88    NA 141 05/31/2013   K 3.6 05/31/2013   CREATININE 1.0 05/31/2013   BUN 19 05/31/2013   INR 3.1* 05/31/2013     Review of Systems  DATA OBTAINED: from patient, nurse, medical record, GENERAL:  Feels "OK", denies pain but would feel more comfortable in chair  No fever,  poor appetite, mild weight loss  SKIN: No itch, rash  EYES: No eye pain, dryness or itching  No change in vision EARS: No earache, poor hearing (not new) NOSE: No congestion, drainage or bleeding MOUTH/THROAT: No mouth or tooth pain  No sore throat   No difficulty chewing or swallowing RESPIRATORY: No cough, No wheezing, SOB CARDIAC: No chest pain, palpitations   GI: No abdominal pain  No N/V/D or constipation  No heartburn or reflux  GU: Indwelling urinary catheter, last changed November 17  MUSCULOSKELETAL: Decreased mobility right hand   NEUROLOGIC: No dizziness, fainting, headache,  No change in mental status PSYCHIATRIC: No sign of anxiety, depression  Increased Sleep.    Physical Exam Filed Vitals:   05/31/13 1506  BP: 87/49  Pulse: 96  Temp: 96.8 F (36 C)  Resp: 22   GENERAL APPEARANCE: No acute distress, appropriately groomed, Overweight body habitus.  Awake, pleasant, conversant.  SKIN: No diaphoresis, rash,  HEAD: Normocephalic, atraumatic EYES: Conjunctiva/lids clear.  EARS:Decreased Hearing, left Aide not in place today ("not worth it") NOSE: No deformity or discharge. MOUTH/THROAT: Lips w/o lesions. Oral mucosa, tongue moist, w/o lesion. Oropharynx w/o redness or lesions.   upper left first molar missing crown  NECK:  No thyroid tenderness, enlargement or nodule LYMPHATICS: No head, neck or supraclavicular adenopathy RESPIRATORY: Breathing is even, nonlabored. Lungs sounds are clear and full throughout  CARDIOVASCULAR: Heart RRR. No murmur or extra heart sounds GASTROINTESTINAL: Abdomen is soft, mild mid abdominal tenderness not  distended w/ normal bowel sounds.  RECTAL: Perianal area is clean, no sign of bleeding at this time  GENITOURINARY: Bladder non tender, not distended, draining blood-tinged urine via catheter. Dark, thick, bloody drainage oozing from the vaginal orifice as well as around urinary catheter. Limited exam is painful,  NEUROLOGIC: Not oriented to time, Oriented place, person.  PSYCHIATRIC:  Mood and affect appropriate to situation   ASSESSMENT/PLAN  Postmenopausal vaginal bleeding New postmenopausal vaginal bleeding and 79 year old female, initially reported as rectal bleeding. No sign of rectal bleeding this afternoon. Unclear source of this bleeding, recommend further evaluation for determination of site. Overall goals for this patient are for limited interventions, but patient and family agree determining source of bleeding is desirable.   Long term (current) use of anticoagulants This patient has been receiving Coumadin long-term for prophylaxis due to recurrent PE. Coumadin was stopped yesterday to 2 bleeding, most recent INR today 3.1. Utility of resuming Coumadin will be  determined when cause of bleeding is identified.    Follow up: As needed    Doug Bucklin T.Randal Yepiz, NP-C 05/31/2013

## 2013-05-31 NOTE — ED Notes (Signed)
Per EMS- Pt from [facility] with c/o of blood in foley bag. Wants eval with for foley or vaginal bleeding.

## 2013-05-31 NOTE — ED Provider Notes (Signed)
CSN: [id number]     Arrival date & time 05/31/13  1611 History   First MD Initiated Contact with Patient 05/31/13 1629     Chief Complaint  Patient presents with  . Hematuria   (Consider location/radiation/quality/duration/timing/severity/associated sxs/prior Treatment) Patient is a 79 y.o. female presenting with hematuria. The history is provided by the patient.  Hematuria This is a new problem. Episode onset: today. The problem occurs constantly. The problem has not changed since onset.Pertinent negatives include no chest pain, no abdominal pain, no headaches and no shortness of breath. Nothing aggravates the symptoms. Nothing relieves the symptoms. She has tried nothing for the symptoms. The treatment provided no relief.    Past Medical History  Diagnosis Date  . Arthritis   . Pulmonary embolism 11/2008  . Debility 10/17/09  . Asthma   . Coagulation defect 03/24/11  . Thyroid disease   . Hypoxemia   . Pneumathemia   . Depression   . Parkinson disease   . Sacroiliitis   . Osteoporosis   . Petechiae   . Dysphagia   . Hyperlipemia   . Neuropathy   . Low vision, one eye, not otherwise specified   . Unspecified essential hypertension   . Other pulmonary embolism and infarction   . Atrial fibrillation   . Heart failure   . Phlebitis and thrombophlebitis of other deep vessels of lower extremities   . Allergic rhinitis due to pollen   . COPD (chronic obstructive pulmonary disease)   . CHF (congestive heart failure)   . Reflux esophagitis   . Inguinal hernia without mention of obstruction or gangrene, unilateral or unspecified, (not specified as recurrent)   . Unspecified constipation   . Osteoarthrosis, unspecified whether generalized or localized, unspecified site   . Pain in joint, lower leg   . Sacroiliitis, not elsewhere classified   . Muscle weakness (generalized)   . Osteoporosis, unspecified   . Pathologic fracture of vertebrae   . Insomnia, unspecified   . Other  malaise and fatigue   . Abnormality of gait   . Rash and other nonspecific skin eruption   . Edema   . Shortness of breath   . Dysphagia, oropharyngeal phase   . Unspecified urinary incontinence   . Nonspecific abnormal electrocardiogram (ECG) (EKG)   . Hypoxemia   . Debility, unspecified   . Closed fracture of dorsal (thoracic) vertebra without mention of spinal cord injury   . Long term (current) use of anticoagulants   . Kyphosis 10/13/09    kyphoplasty  . Rib fractures   . Complication of anesthesia     PNA s/p surgeries  . Hypothyroidism   . Blood transfusion   . GERD (gastroesophageal reflux disease)   . Pneumonia   . Encounter for long-term (current) use of other medications 10/17/2012  . Postmenopausal vaginal bleeding 05/31/2013   Past Surgical History  Procedure Laterality Date  . Tonsillectomy and adenoidectomy    . Breast surgery  1970    Left Benign Breast Bx  . Cataract extraction, bilateral  1992/1995    IOL  . Pressure ulcer debridement      statis ulcer, right leg  . Bladder suspension  2001    AP  . Retinal tear repair cryotherapy    . Plantar fascia surgery    . Nasal sinus surgery    . Appendectomy  2004  . Back surgery    . Hip arthroplasty  05/09/2011    Procedure: ARTHROPLASTY BIPOLAR HIP;  Surgeon: Casimiro Needle  H Handy;  Location: [facility];  Service: Orthopedics;  Laterality: Right;  Hemi-arthroplasty (Depuy) right   No family history on file. History  Substance Use Topics  . Smoking status: Former Smoker    Quit date: 05/07/1976  . Smokeless tobacco: Not on file  . Alcohol Use: 0.6 oz/week    1 Shots of liquor per week     Comment: Glass of Baileys in bed daily [facility]   OB History   Grav Para Term Preterm Abortions TAB SAB Ect Mult Living                 Review of Systems  Constitutional: Negative for fever and fatigue.  HENT: Negative for congestion and drooling.   Eyes: Negative for pain.  Respiratory: Negative for cough and shortness of  breath.   Cardiovascular: Negative for chest pain.  Gastrointestinal: Negative for nausea, vomiting, abdominal pain and diarrhea.  Genitourinary: Positive for hematuria. Negative for dysuria.  Musculoskeletal: Negative for back pain, gait problem and neck pain.  Skin: Negative for color change.  Neurological: Negative for dizziness and headaches.  Hematological: Negative for adenopathy.  Psychiatric/Behavioral: Negative for behavioral problems.  All other systems reviewed and are negative.    Allergies  Banana; Cinobac; Doxycycline hyclate; Fluconazole in dextrose; Mold extract; and Sulfa antibiotics  Home Medications   Current Outpatient Rx  Name  Route  Sig  Dispense  Refill  . acetaminophen (TYLENOL) 500 MG tablet   Oral   Take 500 mg by mouth 3 (three) times daily.         . carbidopa-levodopa (SINEMET) 25-100 MG per tablet   Oral   Take 1 tablet by mouth 3 (three) times daily.           . chlorhexidine (PERIDEX) 0.12 % solution   Mouth/Throat   Use as directed 15 mLs in the mouth or throat 2 (two) times daily.         . diclofenac sodium (VOLTAREN) 1 % GEL   Topical   Apply 4 g topically 2 (two) times daily. Apply to each knee twice daily         . EPINEPHrine (EPI-PEN) 0.3 mg/0.3 mL DEVI   Intramuscular   Inject 0.3 mg into the muscle as needed. For allergic reactions.          . ferrous sulfate 325 (65 FE) MG tablet   Oral   Take 325 mg by mouth 2 (two) times daily with a meal.          . Fluticasone-Salmeterol (ADVAIR) 250-50 MCG/DOSE AEPB   Inhalation   Inhale 1 puff into the lungs 2 (two) times daily.         . furosemide (LASIX) 40 MG tablet   Oral   Take 40 mg by mouth daily.         Marland Kitchen ipratropium (ATROVENT) 0.02 % nebulizer solution   Nebulization   Take 500 mcg by nebulization 2 (two) times daily.          Marland Kitchen levothyroxine (SYNTHROID, LEVOTHROID) 125 MCG tablet   Oral   Take 125 mcg by mouth daily before breakfast.         .  loratadine (CLARITIN) 10 MG tablet   Oral   Take 10 mg by mouth every evening.           Marland Kitchen omeprazole (PRILOSEC) 20 MG capsule   Oral   Take 20 mg by mouth daily.          Marland Kitchen  potassium chloride SA (K-DUR,KLOR-CON) 20 MEQ tablet   Oral   Take 20 mEq by mouth daily.         Marland Kitchen PRESCRIPTION MEDICATION   Intramuscular   Inject 0.1 mLs into the muscle once a week. Allergy Injections per protocol. She takes on Saturdays         . sennosides-docusate sodium (SENOKOT-S) 8.6-50 MG tablet   Oral   Take 2 tablets by mouth at bedtime.   30 tablet   0   . sertraline (ZOLOFT) 25 MG tablet   Oral   Take 50 mg by mouth at bedtime.           BP 131/66  Pulse 92  Temp(Src) 98 F (36.7 C) (Oral)  Resp 22  SpO2 97% Physical Exam  Nursing note and vitals reviewed. Constitutional: She appears well-developed and well-nourished.  HENT:  Head: Normocephalic.  Mouth/Throat: Oropharynx is clear and moist. No oropharyngeal exudate.  Eyes: Conjunctivae and EOM are normal. Pupils are equal, round, and reactive to light.  Neck: Normal range of motion. Neck supple.  Cardiovascular: Normal rate, regular rhythm, normal heart sounds and intact distal pulses.  Exam reveals no gallop and no friction rub.   No murmur heard. Pulmonary/Chest: Effort normal and breath sounds normal. No respiratory distress. She has no wheezes.  Abdominal: Soft. Bowel sounds are normal. There is no tenderness. There is no rebound and no guarding.  Genitourinary:  Melanotic stool appearing substance draining from the vagina. Unable to visualize the cervix during pelvic.   Dark stool on rectal exam.   Hemeoccult pos.   Musculoskeletal: Normal range of motion. She exhibits edema (trace pitting edema in bilateral LE's). She exhibits no tenderness.  Stage 2 sacral ulcer noted.   Neurological: She is alert.  A/o x 2.   Skin: Skin is warm and dry.  Psychiatric: She has a normal mood and affect. Her behavior is normal.     ED Course  Procedures (including critical care time) Labs Review Labs Reviewed  CBC WITH DIFFERENTIAL - Abnormal; Notable for the following:    RBC 3.66 (*)    Hemoglobin 9.9 (*)    HCT 32.1 (*)    RDW 15.8 (*)    Platelets 402 (*)    All other components within normal limits  URINALYSIS W MICROSCOPIC + REFLEX CULTURE - Abnormal; Notable for the following:    APPearance CLOUDY (*)    Hgb urine dipstick MODERATE (*)    Leukocytes, UA LARGE (*)    Bacteria, UA FEW (*)    All other components within normal limits  COMPREHENSIVE METABOLIC PANEL - Abnormal; Notable for the following:    Potassium 3.2 (*)    Albumin 2.7 (*)    Total Bilirubin 0.2 (*)    GFR calc non Af Amer 54 (*)    GFR calc Af Amer 62 (*)    All other components within normal limits  PROTIME-INR - Abnormal; Notable for the following:    Prothrombin Time 28.3 (*)    INR 2.77 (*)    All other components within normal limits  OCCULT BLOOD, POC DEVICE - Abnormal; Notable for the following:    Fecal Occult Bld POSITIVE (*)    All other components within normal limits  URINE CULTURE  MRSA PCR SCREENING  LIPASE, BLOOD  BASIC METABOLIC PANEL  CBC   Imaging Review Ct Abdomen Pelvis W Contrast  05/31/2013   CLINICAL DATA:  Vaginal discharge of melanotic liquid. Evaluate for fistula.  EXAM: CT ABDOMEN AND PELVIS WITH CONTRAST  TECHNIQUE: Multidetector CT imaging of the abdomen and pelvis was performed using the standard protocol following bolus administration of intravenous contrast.  CONTRAST:  OMNIPAQUE IOHEXOL 300 MG/ML  SOLN  COMPARISON:  None  FINDINGS: Lower Chest: Bibasilar interstitial thickening which is nonspecific in this age group. Mild right base scarring. Cardiomegaly, without pericardial or pleural effusion. A small hiatal hernia.  Abdomen/Pelvis: Mild hepatic steatosis, without focal liver lesion. 12 mm low-density splenic lesion on image 21 is nonspecific.  Underdistended proximal stomach. A  duodenal diverticulum. Normal pancreas, gallbladder, biliary tract, adrenal glands. Moderate bilateral renal atrophy, greater on the right. Too small to characterize left renal lesions, likely cysts.  No retroperitoneal or retrocrural adenopathy.  Moderately degraded evaluation of the pelvis secondary to beam hardening artifact from right hip arthroplasty. Wall thickening within the sigmoid colon on image 60/series 2. An adjacent pericolonic nodule versus node of 1.7 cm on image 56/series 2.  Extensive colonic diverticulosis. Ascending colonic wall focal moderate to marked thickening just inferior to the hepatic flexure (image 38/series 2) and just above the ileocecal junction on image 45/ series 2. These may be contiguous or adjacent, when correlated with coronal reformatted images. Neither area is obstructive. There is adjacent moderate to marked adenopathy in the ileocolic mesentery. An index node measures 1.9 cm on image 49.  No small bowel obstruction. Small bowel is positioned within a a right lateral pelvic wall hernia.  Omental/peritoneal disease, including in omental nodule of 1.5 cm on image 59 image 55/series 2. No ascites.  A Foley catheter is identified within the urinary bladder. The sigmoid soft tissue thickening is adjacent to the vaginal cuff (presuming prior hysterectomy). This area is extremely degraded due to beam hardening artifact. No gross free pelvic fluid or adnexal mass.  Bones/Musculoskeletal: Moderate to marked osteopenia. Vertebral augmentation at L4, L5, and T12. Mild T11 compression deformity.  IMPRESSION: 1. Evaluation of the pelvis is moderate to severely degraded secondary to beam hardening artifact from right hip arthroplasty. Vaginal fistula cannot be excluded/evaluated. 2. There is however, ascending and sigmoid colonic marked wall thickening. The ascending colonic wall thickening is favored to represent primary colon carcinoma. Lymphoma would be an alternate consideration, given  absence of obstruction in the extent of adjacent adenopathy. Sigmoid wall thickening is favored to represent a synchronous carcinoma. Serosal based metastasis from the presumed ascending primary would be an alternate consideration. Given the extent of sigmoid wall thickening, and the position of the sigmoid immediately adjacent to the vaginal cuff, this is likely the site of the presumed fistula by clinical findings. 3. Nodal and omental/peritoneal metastasis throughout the abdomen. 4. Nonspecific low-density splenic lesion. 5. Nonobstructive small bowel containing right pelvic wall hernia.   Electronically Signed   By: Jeronimo Greaves M.D.   On: 05/31/2013 21:00      MDM   1. Colovesical fistula   2. UTI (lower urinary tract infection)   3. Abdominal mass   4. Encounter for long-term (current) use of other medications   5. Long term (current) use of anticoagulants    4:53 PM 79 y.o. female who presents from facility with request to evaluate the patient to see if she is having hematuria. The patient has no complaints currently on exam. Her urine appears dark. She has a chronic indwelling Foley. Will replace the Foley, flush it, and check a urinalysis. We'll also perform a pelvic and genital exam to rule out blood coming from other locations.  Will get screening labwork.  CT concerning for colon cancer. Pt also has UTI. Will admit.   Junius Argyle, MD 06/01/13 [id number]

## 2013-05-31 NOTE — Assessment & Plan Note (Signed)
New postmenopausal vaginal bleeding and 79 year old female, initially reported as rectal bleeding. No sign of rectal bleeding this afternoon. Unclear source of this bleeding, recommend further evaluation for determination of site. Overall goals for this patient are for limited interventions, but patient and family agree determining source of bleeding is desirable.

## 2013-06-01 ENCOUNTER — Non-Acute Institutional Stay (SKILLED_NURSING_FACILITY): Payer: Medicare Other | Admitting: Geriatric Medicine

## 2013-06-01 ENCOUNTER — Encounter: Payer: Self-pay | Admitting: Geriatric Medicine

## 2013-06-01 DIAGNOSIS — K6389 Other specified diseases of intestine: Secondary | ICD-10-CM

## 2013-06-01 DIAGNOSIS — N824 Other female intestinal-genital tract fistulae: Secondary | ICD-10-CM

## 2013-06-01 DIAGNOSIS — E876 Hypokalemia: Secondary | ICD-10-CM | POA: Diagnosis present

## 2013-06-01 DIAGNOSIS — D649 Anemia, unspecified: Secondary | ICD-10-CM | POA: Diagnosis present

## 2013-06-01 DIAGNOSIS — Z86711 Personal history of pulmonary embolism: Secondary | ICD-10-CM | POA: Diagnosis present

## 2013-06-01 DIAGNOSIS — Z8679 Personal history of other diseases of the circulatory system: Secondary | ICD-10-CM

## 2013-06-01 LAB — CBC
Hemoglobin: 8.5 g/dL — ABNORMAL LOW (ref 12.0–15.0)
MCH: 27.1 pg (ref 26.0–34.0)
MCHC: 30.8 g/dL (ref 30.0–36.0)
MCV: 87.9 fL (ref 78.0–100.0)
Platelets: 349 10*3/uL (ref 150–400)
RBC: 3.14 MIL/uL — ABNORMAL LOW (ref 3.87–5.11)
RDW: 15.8 % — ABNORMAL HIGH (ref 11.5–15.5)

## 2013-06-01 LAB — BASIC METABOLIC PANEL
BUN: 17 mg/dL (ref 6–23)
Calcium: 8.7 mg/dL (ref 8.4–10.5)
Chloride: 99 mEq/L (ref 96–112)
Creatinine, Ser: 0.84 mg/dL (ref 0.50–1.10)
GFR calc non Af Amer: 58 mL/min — ABNORMAL LOW (ref >90)
Glucose, Bld: 98 mg/dL (ref 70–99)
Sodium: 137 mEq/L (ref 135–145)

## 2013-06-01 LAB — MRSA PCR SCREENING: MRSA by PCR: NEGATIVE

## 2013-06-01 MED ORDER — POTASSIUM CHLORIDE CRYS ER 20 MEQ PO TBCR
20.0000 meq | EXTENDED_RELEASE_TABLET | Freq: Three times a day (TID) | ORAL | Status: DC
  Administered 2013-06-01: 20 meq via ORAL
  Filled 2013-06-01 (×3): qty 1

## 2013-06-01 MED ORDER — MOMETASONE FURO-FORMOTEROL FUM 100-5 MCG/ACT IN AERO
2.0000 | INHALATION_SPRAY | Freq: Two times a day (BID) | RESPIRATORY_TRACT | Status: DC
  Administered 2013-06-01: 2 via RESPIRATORY_TRACT
  Filled 2013-06-01: qty 8.8

## 2013-06-01 MED ORDER — POTASSIUM CHLORIDE CRYS ER 20 MEQ PO TBCR: 20.0000 meq | EXTENDED_RELEASE_TABLET | Freq: Three times a day (TID) | ORAL | Status: DC

## 2013-06-01 MED ORDER — FAMOTIDINE IN NACL 20-0.9 MG/50ML-% IV SOLN: 20.0000 mg | Freq: Once | INTRAVENOUS | Status: DC

## 2013-06-01 MED ORDER — OXYCODONE-ACETAMINOPHEN 5-325 MG PO TABS: 1.0000 | ORAL_TABLET | ORAL | Status: DC | PRN

## 2013-06-01 MED ORDER — CEFUROXIME AXETIL 250 MG PO TABS: 250.0000 mg | ORAL_TABLET | Freq: Two times a day (BID) | ORAL | Status: DC

## 2013-06-01 NOTE — Progress Notes (Signed)
Patient ID: Stacey Mccarthy, female   DOB: [birth year], 79 y.o.   MRN: [id number]  [facility] (31)  Code Status: DNR Contact Information   Name Relation Home Work Mobile   [name]  [phone]         Chief Complaint  Patient presents with  . Hospitalization Follow-up    Colo-vaginalfistula, Colon Ca    HPI: This is a 79 y.o. female resident of [facility]  section is evaluated after hospitalization re: vaginal bleeding.   Last visit:  Postmenopausal vaginal bleeding New postmenopausal vaginal bleeding and 79 year old female, initially reported as rectal bleeding. No sign of rectal bleeding this afternoon. Unclear source of this bleeding, recommend further evaluation for determination of site. Overall goals for this patient are for limited interventions, but patient and family agree determining source of bleeding is desirable.   Long term (current) use of anticoagulants This patient has been receiving Coumadin long-term for prophylaxis due to recurrent PE. Coumadin was stopped yesterday to 2 bleeding, most recent INR today 3.1. Utility of resuming Coumadin will be determined when cause of bleeding is identified.   Hospital evaluation included exam which revealed evidence of colovaginal fistula. CT scan of the abdomen and pelvis revealed right colon and sigmoid colon masses, lymphadenopathy and evidence of colovaginal fistula. Patient declined any further evaluation, does not want any invasive interventions. Patient was discharged back to skilled nursing section at [facility]. 3 days of Ceftin antibiotic has been recommended.   Allergies  Allergen Reactions  . Banana Other (See Comments)    Reaction unknown  . Cinobac [Cinoxacin] Other (See Comments)    Reaction unknown   . Doxycycline Hyclate Other (See Comments)    Reaction unknown   . Fluconazole In Dextrose Other (See Comments)    Reaction unknown   . Mold Extract [Trichophyton Mentagrophyte] Other (See Comments)    Reaction unknown   . Sulfa Antibiotics Other (See Comments)    Reaction unknown    Medications Reviewed  DATA REVIEWED  Radiologic Exams  [facility] X-ray 03/18/2013 chest x-ray: Minimal pneumonitis left lower lobe.  05/31/2013  CT ABDOMEN AND PELVIS WITH CONTRAST IMPRESSION: 1. Evaluation of the pelvis is moderate to severely degraded secondary to beam hardening artifact from right hip arthroplasty. Vaginal fistula cannot be excluded/evaluated. 2. There is however, ascending and sigmoid colonic marked wall thickening. The ascending colonic wall thickening is favored to represent primary colon carcinoma. Lymphoma would be an alternate consideration, given absence of obstruction in the extent of adjacent adenopathy. Sigmoid wall thickening is favored to represent a synchronous carcinoma. Serosal based metastasis from the presumed ascending primary would be an alternate consideration. Given the extent of sigmoid wall thickening, and the position of the sigmoid immediately adjacent to the vaginal cuff, this is likely the site of the presumed fistula by clinical findings. 3. Nodal and omental/peritoneal metastasis throughout the abdomen. 4. Nonspecific low-density splenic lesion. 5. Nonobstructive small bowel containing right pelvic wall hernia.   Cardiovascular Exams:   Laboratory Studies  [facility] 01/23/13 WBC 6.4, Hgb 6.0, Hct 20.3, MCV 68.8, MCH 20.3, RDW 17.1, Plt 356   INR 3.06   Glu 92, BUN 19, Cr. 1.16, na 141, K+ 4.3   TSH 6.48  01/30/2013 WBC 8.6, hemoglobin 9.3, hematocrit 29.7, platelets 314    02/20/2013 WBC 7.8, hemoglobin 8.6, hematocrit 28.5, platelet 30 to   03/29/2013 INR 1.9  10/13 INR 1.72,  10/30 INR 1.84  05/10/2013 WBC 5.9,  hemoglobin 8.7, hematocrit 26.8, platelets 223  INR 2.30  Glucose 90, BUN 18, creatinine 0.89, sodium 140, potassium 4.0  TSH level 11.14    Lab  Results  Component Value Date   WBC 8.1 05/30/2013   HGB 9.4* 05/30/2013   HCT 29* 05/30/2013   PLT 358 05/30/2013        GLUCOSE 99    NA 137 05/30/2013   K 3.5 05/30/2013   CREATININE 0.9 05/30/2013   BUN 21 05/30/2013   INR 3.0* 05/30/2013   Lab Results  Component Value Date   WBC 5.9 05/31/2013   HGB 8.9* 05/31/2013   HCT 28* 05/31/2013   PLT 368 05/31/2013        GLUCOSE 88    NA 141 05/31/2013   K 3.6 05/31/2013   CREATININE 1.0 05/31/2013   BUN 19 05/31/2013   INR 3.1* 05/31/2013     Review of Systems  DATA OBTAINED: from patient, nurse, medical record, GENERAL:  Feels "OK", denies pain but would feel more comfortable in chair  No fever,  poor appetite, mild weight loss  SKIN: No itch, rash  EYES: No eye pain, dryness or itching  No change in vision EARS: No earache, poor hearing (not new) NOSE: No congestion, drainage or bleeding MOUTH/THROAT: No mouth or tooth pain  No sore throat   No difficulty chewing or swallowing RESPIRATORY: No cough, No wheezing, SOB CARDIAC: No chest pain, palpitations   GI: No abdominal pain  No N/V/D or constipation  No heartburn or reflux  GU: Indwelling urinary catheter, changed in ED MUSCULOSKELETAL: Decreased mobility right hand   NEUROLOGIC: No dizziness, fainting, headache,  No change in mental status PSYCHIATRIC: No sign of anxiety, depression  Increased Sleep.    Physical Exam Filed Vitals:   06/01/13 0950  BP: 97/62  Pulse: 93  Temp: 97.2 F (36.2 C)  Resp: 16  SpO2: 98%   GENERAL APPEARANCE: No acute distress, appropriately groomed, Overweight body habitus.  Awake, pleasant, conversant.  SKIN: No diaphoresis, rash,  HEAD: Normocephalic, atraumatic EYES: Conjunctiva/lids clear.  EARS:Decreased Hearing, chronic NOSE: No deformity or discharge. MOUTH/THROAT: Lips w/o lesions. Oral mucosa, tongue moist, w/o lesion. Oropharynx w/o redness or lesions.    NECK:  No thyroid tenderness, enlargement or nodule LYMPHATICS: No head, neck  or supraclavicular adenopathy RESPIRATORY: Breathing is even, nonlabored. Lungs sounds are clear and full throughout  CARDIOVASCULAR: Heart RRR. No murmur or extra heart sounds GASTROINTESTINAL: Abdomen is soft, mild mid abdominal tenderness not distended w/ normal bowel sounds.  RECTAL: Perianal area is clean, no sign of bleeding at this time  GENITOURINARY: Bladder non tender, not distended, draining clear urine via catheter.  NEUROLOGIC: Not oriented to time, Oriented place, person.  PSYCHIATRIC:  Mood and affect appropriate to situation   ASSESSMENT/PLAN  Colonic mass CT scan of abdomen and pelvis with evidence of primary right colon lesion and synchronous sigmoid colon lesion with lymphadenopathy. Also with nodal involvement of the omentum. There is also a colovaginal fistula. This represents a terminal disease state for this patient, she is aware of the fistula and colon tumors. Patient has asked me to limit interventions, her only desire is to be comfortable. The patient's daughter Bev is at the bedside, understands the situation and agrees with her Mom's wishes. Patient is comfortable at this time, continue current medications, encourage p.o. intake as she is able, initiate use of oral pain medications as needed.     Follow up: As needed  Shauntay Brunelli T.Nigel Wessman, NP-C 06/01/2013

## 2013-06-01 NOTE — Progress Notes (Signed)
UR completed 

## 2013-06-01 NOTE — Progress Notes (Signed)
Clinical Social Work Department BRIEF PSYCHOSOCIAL ASSESSMENT 06/01/2013  Patient:  Stacey Mccarthy, Stacey Mccarthy     Account Number:  [id number]     Admit date:  05/31/2013  Clinical Social Worker:  Jacelyn Grip  Date/Time:  06/01/2013 10:30 AM  Referred by:  Physician  Date Referred:  06/01/2013 Referred for  SNF Placement   Other Referral:   Interview type:  Patient Other interview type:   family    PSYCHOSOCIAL DATA Living Status:  FACILITY Admitted from facility:  [facility] Level of care:  Skilled Nursing Facility Primary support name:  [name]/daughter Primary support relationship to patient:  CHILD, ADULT Degree of support available:   strong    CURRENT CONCERNS Current Concerns  Post-Acute Placement   Other Concerns:    SOCIAL WORK ASSESSMENT / PLAN CSW received referral that pt admitted from [facility].    CSW met with pt, pt daughter, and pt son at bedside. CSW introduced self and explained role. Pt and pt family confirmed that pt a long term resident at [facility] and plans to return. Pt family hopeful that pt will be able to discharge back to [facility] today. Pt family awaiting to see MD.    CSW contacted [facility] who confirmed that pt cna return upon discharge and if pt for discharge today then pt welcome to return to facility today.    CSW to facilitate pt discharge needs when pt medically ready for discharge.   Assessment/plan status:  Psychosocial Support/Ongoing Assessment of Needs Other assessment/ plan:   discharge planning   Information/referral to community resources:   Referral back to [facility]    PATIENT'S/FAMILY'S RESPONSE TO PLAN OF CARE: Pt alert and oriented x 4. Pt and pt family have been very satisfied with [facility] as pt is a long term resident at facility. Pt family eager for pt to be discharged back to [facility].    [name], MSW, LCSWA  Clinical Social Work [phone]

## 2013-06-01 NOTE — Progress Notes (Signed)
Patient picked up via [facility] for transportation to [facility].  Patient appears stable for discharge. Pt's daughter at the bedside and aware of discharge plan.   Allayne Butcher [facility]  06/01/2013

## 2013-06-01 NOTE — Discharge Summary (Signed)
Physician Discharge Summary  Cephus Slater ZOX:[id number] DOB: [birth year] DOA: 05/31/2013  PCP: Kimber Relic, MD  Admit date: 05/31/2013 Discharge date: 06/01/2013  Recommendations for Outpatient Follow-up:  1. Consider referral to hospice or palliative care given evidence of colon mass and family's wishes to avoid further diagnostic testing and treatment. 2. Note: Hypokalemic. Potassium supplementation dose increased. Consider rechecking potassium and 2-3 days and adjusting supplementation dose if needed. 3. Followup urine cultures.  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 Active Problems:    Long term (current) use of anticoagulants    COPD (chronic obstructive pulmonary disease)    Parkinson's disease    UTI (lower urinary tract infection)    Abdominal mass    History of atrial fibrillation    History of pulmonary embolism    Hypokalemia    Normocytic anemia   Discharge Condition: Terminal.  Diet recommendation: Regular.  History of present illness:  Stacey Mccarthy is an 79 y.o. female with a PMH of pulmonary embolism and atrial fibrillation on chronic anticoagulation and multiple other medical problems who was admitted on 05/31/2013 after being sent to the hospital for evaluation from her SNF secondary to concerns for hematuria. A pelvic exam done on admission showed stool in her vagina. CT scan subsequently showed probable rectal mass with lymphadenopathy concerning for malignancy.   Hospital Course by problem:  Principal Problem:  Colovesical fistula / Abdominal mass secondary to presumed colon cancer  Differential diagnosis lymphoma versus colon cancer. Patient's family does not wish any further diagnostic workup or treatment for this given her overall frailty and poor prognosis. They request discharged back to her facility for comfort care. Active Problems:  Normocytic anemia  Likely  anemia of chronic disease. Chronic with baseline hemoglobin 8-9 mg/dL. No current indication for transfusion. Continue iron supplementation therapy.  Hypokalemia  Likely from diuretic treatment. Increase supplementation dose to 20 mEq 3 times a day. Magnesium level okay.  Hypothyroidism  Continue Synthroid.  COPD  Resume Advair. Bronchodilators as needed ordered.  Long term (current) use of anticoagulants / H/O atrial fibrillation / H/O PE  Coumadin currently on hold.  Parkinson's disease  Stable on Sinemet.  UTI (lower urinary tract infection)  Placed on empiric Rocephin. We'll discharge home on 3 days of Ceftin therapy.   Procedures:  None.  Consultations:  None.  Discharge Exam: Filed Vitals:   06/01/13 0515  BP: 110/62  Pulse: 91  Temp: 98.6 F (37 C)  Resp: 20   Filed Vitals:   05/31/13 1632 05/31/13 2345 06/01/13 0515 06/01/13 0907  BP: 131/66 120/60 110/62   Pulse: 92 93 91   Temp: 98 F (36.7 C) 97 F (36.1 C) 98.6 F (37 C)   TempSrc: Oral Oral Oral   Resp: 22 20 20    Height:  5\' 5"  (1.651 m)    SpO2: 97% 98% 97% 98%    Gen:  NAD Cardiovascular:  RRR, No M/R/G Respiratory: Lungs CTAB Gastrointestinal: Abdomen soft, NT/ND with normal active bowel sounds. Extremities: No C/E/C, +scattered ecchymosis   Discharge Instructions      Discharge Orders   Future Orders Complete By Expires   Call MD for:  severe uncontrolled pain  As directed    Diet general  As directed    Discharge instructions  As directed    Comments:     You were cared for by Dr. Hillery Aldo  (a hospitalist) during your hospital stay. If  you have any questions about your discharge medications or the care you received while you were in the hospital after you are discharged, you can call the unit and ask to speak with the hospitalist on call if the hospitalist that took care of you is not available. Once you are discharged, your primary care physician will handle any further medical  issues. Please note that NO REFILLS for any discharge medications will be authorized once you are discharged, as it is imperative that you return to your primary care physician (or establish a relationship with a primary care physician if you do not have one) for your aftercare needs so that they can reassess your need for medications and monitor your lab values.  Any outstanding tests can be reviewed by your PCP at your follow up visit.  It is also important to review any medicine changes with your PCP.  Please bring these d/c instructions with you to your next visit so your physician can review these changes with you.  If you do not have a primary care physician, you can call [phone] for a physician referral.  It is highly recommended that you obtain a PCP for hospital follow up.   Increase activity slowly  As directed        Medication List         acetaminophen 500 MG tablet  Commonly known as:  TYLENOL  Take 500 mg by mouth 3 (three) times daily.     carbidopa-levodopa 25-100 MG per tablet  Commonly known as:  SINEMET IR  Take 1 tablet by mouth 3 (three) times daily.     cefUROXime 250 MG tablet  Commonly known as:  CEFTIN  Take 1 tablet (250 mg total) by mouth 2 (two) times daily with a meal.     chlorhexidine 0.12 % solution  Commonly known as:  PERIDEX  Use as directed 15 mLs in the mouth or throat 2 (two) times daily.     diclofenac sodium 1 % Gel  Commonly known as:  VOLTAREN  Apply 4 g topically 2 (two) times daily. Apply to each knee twice daily     EPINEPHrine 0.3 mg/0.3 mL Devi  Commonly known as:  EPI-PEN  Inject 0.3 mg into the muscle as needed. For allergic reactions.     ferrous sulfate 325 (65 FE) MG tablet  Take 325 mg by mouth 2 (two) times daily with a meal.     Fluticasone-Salmeterol 250-50 MCG/DOSE Aepb  Commonly known as:  ADVAIR  Inhale 1 puff into the lungs 2 (two) times daily.     furosemide 40 MG tablet  Commonly known as:  LASIX  Take 40 mg by  mouth daily.     ipratropium 0.02 % nebulizer solution  Commonly known as:  ATROVENT  Take 500 mcg by nebulization 2 (two) times daily.     levothyroxine 125 MCG tablet  Commonly known as:  SYNTHROID, LEVOTHROID  Take 125 mcg by mouth daily before breakfast.     loratadine 10 MG tablet  Commonly known as:  CLARITIN  Take 10 mg by mouth every evening.     omeprazole 20 MG capsule  Commonly known as:  PRILOSEC  Take 20 mg by mouth daily.     oxyCODONE-acetaminophen 5-325 MG per tablet  Commonly known as:  ROXICET  Take 1 tablet by mouth every 4 (four) hours as needed for moderate pain or severe pain.     potassium chloride SA 20 MEQ tablet  Commonly  known as:  K-DUR,KLOR-CON  Take 1 tablet (20 mEq total) by mouth 3 (three) times daily.     PRESCRIPTION MEDICATION  Inject 0.1 mLs into the muscle once a week. Allergy Injections per protocol. She takes on Saturdays     sennosides-docusate sodium 8.6-50 MG tablet  Commonly known as:  SENOKOT-S  Take 2 tablets by mouth at bedtime.     sertraline 25 MG tablet  Commonly known as:  ZOLOFT  Take 50 mg by mouth at bedtime.       Follow-up Information   Schedule an appointment as soon as possible for a visit with GREEN, Lenon Curt, MD. (As needed)    Specialty:  Internal Medicine   Contact information:   [address] Jeanella [place] [address] [phone]        The results of significant diagnostics from this hospitalization (including imaging, microbiology, ancillary and laboratory) are listed below for reference.    Significant Diagnostic Studies: Ct Abdomen Pelvis W Contrast  05/31/2013   CLINICAL DATA:  Vaginal discharge of melanotic liquid. Evaluate for fistula.  EXAM: CT ABDOMEN AND PELVIS WITH CONTRAST  TECHNIQUE: Multidetector CT imaging of the abdomen and pelvis was performed using the standard protocol following bolus administration of intravenous contrast.  CONTRAST:  OMNIPAQUE IOHEXOL 300 MG/ML  SOLN   COMPARISON:  None  FINDINGS: Lower Chest: Bibasilar interstitial thickening which is nonspecific in this age group. Mild right base scarring. Cardiomegaly, without pericardial or pleural effusion. A small hiatal hernia.  Abdomen/Pelvis: Mild hepatic steatosis, without focal liver lesion. 12 mm low-density splenic lesion on image 21 is nonspecific.  Underdistended proximal stomach. A duodenal diverticulum. Normal pancreas, gallbladder, biliary tract, adrenal glands. Moderate bilateral renal atrophy, greater on the right. Too small to characterize left renal lesions, likely cysts.  No retroperitoneal or retrocrural adenopathy.  Moderately degraded evaluation of the pelvis secondary to beam hardening artifact from right hip arthroplasty. Wall thickening within the sigmoid colon on image 60/series 2. An adjacent pericolonic nodule versus node of 1.7 cm on image 56/series 2.  Extensive colonic diverticulosis. Ascending colonic wall focal moderate to marked thickening just inferior to the hepatic flexure (image 38/series 2) and just above the ileocecal junction on image 45/ series 2. These may be contiguous or adjacent, when correlated with coronal reformatted images. Neither area is obstructive. There is adjacent moderate to marked adenopathy in the ileocolic mesentery. An index node measures 1.9 cm on image 49.  No small bowel obstruction. Small bowel is positioned within a a right lateral pelvic wall hernia.  Omental/peritoneal disease, including in omental nodule of 1.5 cm on image 59 image 55/series 2. No ascites.  A Foley catheter is identified within the urinary bladder. The sigmoid soft tissue thickening is adjacent to the vaginal cuff (presuming prior hysterectomy). This area is extremely degraded due to beam hardening artifact. No gross free pelvic fluid or adnexal mass.  Bones/Musculoskeletal: Moderate to marked osteopenia. Vertebral augmentation at L4, L5, and T12. Mild T11 compression deformity.  IMPRESSION:  1. Evaluation of the pelvis is moderate to severely degraded secondary to beam hardening artifact from right hip arthroplasty. Vaginal fistula cannot be excluded/evaluated. 2. There is however, ascending and sigmoid colonic marked wall thickening. The ascending colonic wall thickening is favored to represent primary colon carcinoma. Lymphoma would be an alternate consideration, given absence of obstruction in the extent of adjacent adenopathy. Sigmoid wall thickening is favored to represent a synchronous carcinoma. Serosal based metastasis from the presumed ascending primary  would be an alternate consideration. Given the extent of sigmoid wall thickening, and the position of the sigmoid immediately adjacent to the vaginal cuff, this is likely the site of the presumed fistula by clinical findings. 3. Nodal and omental/peritoneal metastasis throughout the abdomen. 4. Nonspecific low-density splenic lesion. 5. Nonobstructive small bowel containing right pelvic wall hernia.   Electronically Signed   By: Jeronimo Greaves M.D.   On: 05/31/2013 21:00    Labs:  Basic Metabolic Panel:  Recent Labs Lab 05/30/13 05/31/13 05/31/13 1727 06/01/13 0510  NA 137 141 136 137  K 3.5 3.6 3.2* 2.9*  CL  --   --  96 99  CO2  --   --  29 27  GLUCOSE  --   --  99 98  BUN 21 19 20 17   CREATININE 0.9 1.0 0.89 0.84  CALCIUM  --   --  9.1 8.7  MG  --   --   --  1.8   GFR The CrCl is unknown because both a height and weight (above a minimum accepted value) are required for this calculation. Liver Function Tests:  Recent Labs Lab 05/31/13 1727  AST 11  ALT 5  ALKPHOS 80  BILITOT 0.2*  PROT 6.8  ALBUMIN 2.7*    Recent Labs Lab 05/31/13 1727  LIPASE 21   Coagulation profile  Recent Labs Lab 05/30/13 05/31/13 05/31/13 1727  INR 3.0* 3.1* 2.77*    CBC:  Recent Labs Lab 05/30/13 05/31/13 05/31/13 1730 06/01/13 0510  WBC 8.1 5.9 8.8 7.5  NEUTROABS  --   --  6.5  --   HGB 9.4* 8.9* 9.9* 8.5*  HCT  29* 28* 32.1* 27.6*  MCV  --   --  87.7 87.9  PLT 358 368 402* 349   Microbiology Recent Results (from the past 240 hour(s))  MRSA PCR SCREENING     Status: None   Collection Time    06/01/13 12:17 AM      Result Value Range Status   MRSA by PCR NEGATIVE  NEGATIVE Final   Comment:            The GeneXpert MRSA Assay (FDA     approved for NASAL specimens     only), is one component of a     comprehensive MRSA colonization     surveillance program. It is not     intended to diagnose MRSA     infection nor to guide or     monitor treatment for     MRSA infections.    Time coordinating discharge: 35 minutes.  Signed:  RAMA,CHRISTINA  Pager [phone] [facility] 06/01/2013, 11:37 AM

## 2013-06-01 NOTE — Progress Notes (Signed)
Patient to be transported to [facility].  Report called to [employer], Charity fundraiser.  Awaiting transport.  Allayne Butcher [facility]  06/01/2013  12:54 PM

## 2013-06-01 NOTE — Plan of Care (Signed)
Problem: Phase I Progression Outcomes Goal: Voiding-avoid urinary catheter unless indicated Outcome: Not Applicable Date Met:  06/01/13 Patient has chronic indwelling foley catheter.  Problem: Phase III Progression Outcomes Goal: Foley discontinued Outcome: Not Applicable Date Met:  06/01/13 Patient will retain foley catheter for discharge.

## 2013-06-01 NOTE — Progress Notes (Signed)
Pt for discharge to [facility].  CSW facilitated pt discharge needs including contacting facility, faxing pt discharge information via [facility], discussing with pt and pt daughter at bedside, providing RN phone number to call report, and arranging ambulance transport for pt to [facility].   No further social work needs identified at this time.  CSW signing off.   [name], MSW, LCSWA  Clinical Social Work [phone]

## 2013-06-02 LAB — URINE CULTURE

## 2013-06-04 ENCOUNTER — Encounter: Payer: Self-pay | Admitting: Geriatric Medicine

## 2013-06-04 NOTE — Assessment & Plan Note (Addendum)
CT scan of abdomen and pelvis with evidence of primary right colon lesion and synchronous sigmoid colon lesion with lymphadenopathy. Also with nodal involvement of the omentum. There is also a colovaginal fistula. This represents a terminal disease state for this patient, she is aware of the fistula and colon tumors. Patient has asked me to limit interventions, her only desire is to be comfortable. The patient's daughter Bev is at the bedside, understands the situation and agrees with her Mom's wishes. Patient is comfortable at this time, continue current medications, encourage p.o. intake as she is able, initiate use of oral pain medications as needed.

## 2013-06-18 ENCOUNTER — Encounter: Payer: Self-pay | Admitting: Geriatric Medicine

## 2013-06-18 ENCOUNTER — Non-Acute Institutional Stay (SKILLED_NURSING_FACILITY): Payer: Medicare Other | Admitting: Geriatric Medicine

## 2013-06-18 DIAGNOSIS — N824 Other female intestinal-genital tract fistulae: Secondary | ICD-10-CM

## 2013-06-18 DIAGNOSIS — R21 Rash and other nonspecific skin eruption: Secondary | ICD-10-CM

## 2013-06-18 DIAGNOSIS — K6389 Other specified diseases of intestine: Secondary | ICD-10-CM

## 2013-06-18 NOTE — Assessment & Plan Note (Addendum)
Primary right colon lesion, synchronous sigmoid colon lesion with lymphadenopathy, nodal involvement of the omentum and colovaginal fistula. No frank rectal or vaginal bleeding since warfarin stopped. Pain has increased, combination of skin pain and rectal pain. Patient does not ask for pain medication, will schedule oxycodone, keep MSO4 prn. Continue to provide compfort measures. Discussed situation/ plan with sons Madilyn Fireman, Jeannett Senior) today.

## 2013-06-18 NOTE — Assessment & Plan Note (Signed)
Buttock skin condition and breakdown combination of moisture, irritation. Very painful. Continue clobetasol. Scheduled pain medication should be of benefit

## 2013-06-18 NOTE — Assessment & Plan Note (Signed)
No recent drainage reported from vagina

## 2013-06-18 NOTE — Progress Notes (Signed)
Patient ID: Stacey Mccarthy, female   DOB: 1916/03/29, 79 y.o.   MRN: [id number]  [facility] (31)  Code Status: DNR Contact Information   Name Relation Home Work Mobile   [name]  [phone]         Chief Complaint  Patient presents with  . Colon mass  . Rash    HPI: This is a 79 y.o. female resident of [facility]  section is evaluated in follow up of colon mass, buttock rash.    Last visit:  Colonic mass CT scan of abdomen and pelvis with evidence of primary right colon lesion and synchronous sigmoid colon lesion with lymphadenopathy. Also with nodal involvement of the omentum. There is also a colovaginal fistula. This represents a terminal disease state for this patient, she is aware of the fistula and colon tumors. Patient has asked me to limit interventions, her only desire is to be comfortable. The patient's daughter Bev is at the bedside, understands the situation and agrees with her Mom's wishes. Patient is comfortable at this time, continue current medications, encourage p.o. intake as she is able, initiate use of oral pain medications as needed.   Since last visit patient's overall condition has declined. She is eating very little, does continue to drink adequate fluid daily. Her overall strength and physical abilities have declined, she now requires transfer with [facility] lift. There's been no significant rectal or vaginal bleeding noted, warfarin has been discontinued.  Patient is having bowel movements every one to 2 days. Urine output has decreased, urine remains clear.  Skin on buttocks is noted to be very dry and flaky and discolored, skin changes not consistent with pressure ulceration. Is currently being treated with clobetasol ointment and attempts at pressure reduction.  Patient has been expressed increased pain on the bottom as well as at the sacrum. Morphine sulfate liquid has been added to  p.r.n. Percocet. Nurses report patient does not request pain medicine until her pain is very bad.   Allergies  Allergen Reactions  . Banana Other (See Comments)    Reaction unknown  . Cinobac [Cinoxacin] Other (See Comments)    Reaction unknown   . Doxycycline Hyclate Other (See Comments)    Reaction unknown   . Fluconazole In Dextrose Other (See Comments)    Reaction unknown   . Mold Extract [Trichophyton Mentagrophyte] Other (See Comments)    Reaction unknown   . Sulfa Antibiotics Other (See Comments)    Reaction unknown    Medications Reviewed  DATA REVIEWED  Radiologic Exams  [facility] X-ray 03/18/2013 chest x-ray: Minimal pneumonitis left lower lobe.  05/31/2013  CT ABDOMEN AND PELVIS WITH CONTRAST IMPRESSION: 1. Evaluation of the pelvis is moderate to severely degraded secondary to beam hardening artifact from right hip arthroplasty. Vaginal fistula cannot be excluded/evaluated. 2. There is however, ascending and sigmoid colonic marked wall thickening. The ascending colonic wall thickening is favored to represent primary colon carcinoma. Lymphoma would be an alternate consideration, given absence of obstruction in the extent of adjacent adenopathy. Sigmoid wall thickening is favored to represent a synchronous carcinoma. Serosal based metastasis from the presumed ascending primary would be an alternate consideration. Given the extent of sigmoid wall thickening, and the position of the sigmoid immediately adjacent to the vaginal cuff, this is likely the site of the presumed fistula by clinical findings. 3. Nodal and omental/peritoneal metastasis throughout the abdomen. 4. Nonspecific low-density splenic lesion. 5. Nonobstructive small bowel containing right  pelvic wall hernia.   Cardiovascular Exams:   Laboratory Studies  [facility] 01/23/13 WBC 6.4, Hgb 6.0, Hct 20.3, MCV 68.8, MCH 20.3, RDW 17.1, Plt 356   INR 3.06   Glu 92, BUN 19, Cr. 1.16, na 141,  K+ 4.3   TSH 6.48  01/30/2013 WBC 8.6, hemoglobin 9.3, hematocrit 29.7, platelets 314    02/20/2013 WBC 7.8, hemoglobin 8.6, hematocrit 28.5, platelet 30 to   03/29/2013 INR 1.9  10/13 INR 1.72,  10/30 INR 1.84  05/10/2013 WBC 5.9, hemoglobin 8.7, hematocrit 26.8, platelets 223  INR 2.30  Glucose 90, BUN 18, creatinine 0.89, sodium 140, potassium 4.0  TSH level 11.14    Lab Results  Component Value Date   WBC 8.1 05/30/2013   HGB 9.4* 05/30/2013   HCT 29* 05/30/2013   PLT 358 05/30/2013        GLUCOSE 99    NA 137 05/30/2013   K 3.5 05/30/2013   CREATININE 0.9 05/30/2013   BUN 21 05/30/2013   INR 3.0* 05/30/2013   Lab Results  Component Value Date   WBC 5.9 05/31/2013   HGB 8.9* 05/31/2013   HCT 28* 05/31/2013   PLT 368 05/31/2013        GLUCOSE 88    NA 141 05/31/2013   K 3.6 05/31/2013   CREATININE 1.0 05/31/2013   BUN 19 05/31/2013   INR 3.1* 05/31/2013     Review of Systems  DATA OBTAINED: from patient, nurse, medical record, GENERAL:  Does not feel well "I hurt"   No fever,  poor appetite SKIN: Buttock rash  EYES: No eye pain, dryness or itching  No change in vision EARS: No earache, poor hearing (not new) NOSE: No congestion, drainage or bleeding MOUTH/THROAT: No mouth or tooth pain    No sore throat  Holds pills in her mouth, very slow to swallow pills or food RESPIRATORY: No cough, No wheezing, SOB CARDIAC: No chest pain, palpitations   GI: No abdominal pain  No N/V/D or constipation  No heartburn or reflux  GU: Indwelling urinary catheter MUSCULOSKELETAL: Decreased mobility right hand , increased generalized weakness NEUROLOGIC: No dizziness, fainting, headache,  No change in mental status PSYCHIATRIC: No sign of anxiety, depression  Increased Sleep.    Physical Exam Filed Vitals:   06/18/13 1216  BP: 101/60  Pulse: 87  Temp: 97.3 F (36.3 C)  Resp: 15  SpO2: 95%   GENERAL APPEARANCE: No acute distress, appropriately groomed, Overweight body habitus.   Awake, pleasant, conversant.  SKIN: Buttock skin is pink, dry with some flaky areas, few superficial open areas.  HEAD: Normocephalic, atraumatic EYES: Conjunctiva/lids clear.  EARS:Decreased Hearing, chronic NOSE: No deformity or discharge. MOUTH/THROAT: Lips w/o lesions. Oral mucosa, tongue moist, w/o lesion. Oropharynx w/o redness or lesions.    LYMPHATICS: No head, neck or supraclavicular adenopathy RESPIRATORY: Breathing is even, nonlabored. Lungs sounds are clear and full throughout  CARDIOVASCULAR: Heart RRR. No murmur or extra heart sounds GASTROINTESTINAL: Abdomen is soft, mild mid abdominal tenderness not distended w/ normal bowel sounds.  RECTAL: Perianal area is clean, no sign of bleeding at this time  GENITOURINARY: Bladder non tender, not distended, draining clear urine via catheter.  NEUROLOGIC: Not oriented to time, Oriented place, person.  PSYCHIATRIC:  Mood and affect appropriate to situation   ASSESSMENT/PLAN  Colovaginal fistula No recent drainage reported from vagina  Colonic mass Primary right colon lesion, synchronous sigmoid colon lesion with lymphadenopathy, nodal involvement of the omentum and colovaginal fistula.  No frank rectal or vaginal bleeding since warfarin stopped. Pain has increased, combination of skin pain and rectal pain. Patient does not ask for pain medication, will schedule oxycodone, keep MSO4 prn. Continue to provide compfort measures. Discussed situation/ plan with sons Madilyn Fireman, Jeannett Senior) today.   Rash and nonspecific skin eruption Buttock skin condition and breakdown combination of moisture, irritation. Very painful. Continue clobetasol. Scheduled pain medication should be of benefit   Follow up: As needed    Keylie Beavers T.Appollonia Klee, NP-C 06/18/2013

## 2013-07-17 ENCOUNTER — Encounter: Payer: Self-pay | Admitting: Geriatric Medicine

## 2013-07-17 ENCOUNTER — Non-Acute Institutional Stay (SKILLED_NURSING_FACILITY): Payer: Medicare Other | Admitting: Geriatric Medicine

## 2013-07-17 DIAGNOSIS — C189 Malignant neoplasm of colon, unspecified: Secondary | ICD-10-CM

## 2013-07-17 DIAGNOSIS — N824 Other female intestinal-genital tract fistulae: Secondary | ICD-10-CM

## 2013-07-17 NOTE — Progress Notes (Signed)
Patient ID: Stacey Mccarthy, female   DOB: 1915/10/19, 80 y.o.   MRN: [id number]  [facility] (31)  Code Status: DNR Contact Information   Name Relation Home Work [place]  [phone]         Chief Complaint  Patient presents with  . Colon cancer/ fistula    HPI: This is a 80 y.o. female resident of [facility]  section is evaluated in follow up of colon mass, general decline.    Last visit:  Colovaginal fistula No recent drainage reported from vagina  Colonic mass Primary right colon lesion, synchronous sigmoid colon lesion with lymphadenopathy, nodal involvement of the omentum and colovaginal fistula. No frank rectal or vaginal bleeding since warfarin stopped. Pain has increased, combination of skin pain and rectal pain. Patient does not ask for pain medication, will schedule oxycodone, keep MSO4 prn. Continue to provide compfort measures. Discussed situation/ plan with sons Amedeo Plenty, Annie Main) today.   Rash and nonspecific skin eruption Buttock skin condition and breakdown combination of moisture, irritation. Very painful. Continue clobetasol. Scheduled pain medication should be of benefit  Since last visit, patient has continued to decline. Her p.o. intake has been markedly reduced for the last 2 weeks, she does continue to drink at intervals, especially pink lemonade. She's not been able to swallow pills; medication list was simplified last week. Pain is being managed with scheduled and p.r.n. dosing of liquid morphine.  Patient's children remain supportive, visit daily. Nursing staff tells me the patient has been saying "goodbye" to the CNAs for the last 2 days, daughter reports patient frequently says her she wants to go home.   Allergies  Allergen Reactions  . Banana Other (See Comments)    Reaction unknown  . Cinobac [Cinoxacin] Other (See Comments)    Reaction unknown   . Doxycycline  Hyclate Other (See Comments)    Reaction unknown   . Fluconazole In Dextrose Other (See Comments)    Reaction unknown   . Mold Extract [Trichophyton Mentagrophyte] Other (See Comments)    Reaction unknown   . Sulfa Antibiotics Other (See Comments)    Reaction unknown    MEDICATION - Reviewed  DATA REVIEWED  Radiologic Exams  [facility] X-ray 03/18/2013 chest x-ray: Minimal pneumonitis left lower lobe.  05/31/2013  CT ABDOMEN AND PELVIS WITH CONTRAST IMPRESSION: 1. Evaluation of the pelvis is moderate to severely degraded secondary to beam hardening artifact from right hip arthroplasty. Vaginal fistula cannot be excluded/evaluated. 2. There is however, ascending and sigmoid colonic marked wall thickening. The ascending colonic wall thickening is favored to represent primary colon carcinoma. Lymphoma would be an alternate consideration, given absence of obstruction in the extent of adjacent adenopathy. Sigmoid wall thickening is favored to represent a synchronous carcinoma. Serosal based metastasis from the presumed ascending primary would be an alternate consideration. Given the extent of sigmoid wall thickening, and the position of the sigmoid immediately adjacent to the vaginal cuff, this is likely the site of the presumed fistula by clinical findings. 3. Nodal and omental/peritoneal metastasis throughout the abdomen. 4. Nonspecific low-density splenic lesion. 5. Nonobstructive small bowel containing right pelvic wall hernia.   Cardiovascular Exams:   Laboratory Studies  [facility]  05/10/2013 WBC 5.9, hemoglobin 8.7, hematocrit 26.8, platelets 223  INR 2.30  Glucose 90, BUN 18, creatinine 0.89, sodium 140, potassium 4.0  TSH level 11.14    Lab Results  Component Value Date   WBC 8.1 05/30/2013  HGB 9.4* 05/30/2013   HCT 29* 05/30/2013   PLT 358 05/30/2013        GLUCOSE 99    NA 137 05/30/2013   K 3.5 05/30/2013   CREATININE 0.9 05/30/2013   BUN 21  05/30/2013   INR 3.0* 05/30/2013   Lab Results  Component Value Date   WBC 5.9 05/31/2013   HGB 8.9* 05/31/2013   HCT 28* 05/31/2013   PLT 368 05/31/2013        GLUCOSE 88    NA 141 05/31/2013   K 3.6 05/31/2013   CREATININE 1.0 05/31/2013   BUN 19 05/31/2013   INR 3.1* 05/31/2013    REVIEW OF SYSTEMS - minimal due to patient's decreased level of alertness DATA OBTAINED: from patient, nurse, medical record, GENERAL:  " Im OK"   No fever,  minimal PO intakre MOUTH/THROAT: No mouth or tooth pain    No sore throat  Unable to swallow pills  RESPIRATORY: No cough, No wheezing, SOB CARDIAC: No chest pain,   GI: Denies abdominal pain  No N/V/D    last BM 07/14/2013 GU: Indwelling urinary catheter, decreased output  NEUROLOGIC:  Decreased level of alertness  PSYCHIATRIC: No sign of anxiety  Increased Sleep.    PHYSICAL EXAM  Filed Vitals:   07/17/13 1605  Temp: 96.7 F (35.9 C)  SpO2: 95%   GENERAL APPEARANCE: No acute distress, appropriately groomed,  appearance consistent with  significant weight loss.  Asleep on arrival, awakens easily but remains lethargic. HEAD: Normocephalic, atraumatic EYES: Conjunctiva/lids clear.  EARS:Decreased Hearing, chronic NOSE: No deformity or discharge. MOUTH/THROAT: Lips w/o lesions.  Fruity odor to breath RESPIRATORY: Breathing is even, nonlabored. breath sounds are clear anterior, shallow  CARDIOVASCULAR: Heart RRR. No murmur or extra heart sounds GASTROINTESTINAL: There is an odor of stool, No BM in undergarment GENITOURINARY:  draining clear urine via catheter. Decreased output,  approx 300cc/day last several days NEUROLOGIC: Not oriented to time, Oriented to person.  PSYCHIATRIC:   Withdrawn  ASSESSMENT/PLAN  Colovaginal fistula No recent drainage reported  Colon cancer Primary right colon lesion, synchronous sigmoid colon lesion with lymphadenopathy, nodal involvement of the omentum and colovaginal fistula. No frank rectal or vaginal  bleeding since warfarin stopped. Patient with general, slow decline. Minimal p.o. intake, no bowel movement last several days. Pain is adequately controlled with current schedule of morphine.   Multiple discussions have been held with the patient's daughter and sons over the last several weeks (including today)Re: disease progression and changes at end of life. Anticipate end of life in hours to days.   Time: 30 minutes, >50% spent counseling/or care coordination  Follow up: As needed    Ettore Trebilcock T.Janice Seales, NP-C 07/17/2013

## 2013-07-17 NOTE — Assessment & Plan Note (Addendum)
Primary right colon lesion, synchronous sigmoid colon lesion with lymphadenopathy, nodal involvement of the omentum and colovaginal fistula. No frank rectal or vaginal bleeding since warfarin stopped. Patient with general, slow decline. Minimal p.o. intake, no bowel movement last several days. Pain is adequately controlled with current schedule of morphine.   Multiple discussions have been held with the patient's daughter and sons over the last several weeks (including today)Re: disease progression and changes at end of life. Anticipate end of life in hours to days.

## 2013-07-17 NOTE — Assessment & Plan Note (Signed)
No recent drainage reported

## 2013-07-29 DEATH — deceased

## 2014-05-15 IMAGING — CT CT ABD-PELV W/ CM
1 of 3 series · 13 of 32 positions shown, 17 images · IV contrast (OMNIPAQUE 300)
Comparison: None

CLINICAL DATA: Vaginal discharge of melanotic liquid. Evaluate for
fistula.

EXAM:
CT ABDOMEN AND PELVIS WITH CONTRAST
TECHNIQUE: Multidetector CT imaging of the abdomen and pelvis was performed
using the standard protocol following bolus administration of
intravenous contrast.
CONTRAST:  100mL OMNIPAQUE IOHEXOL 300 MG/ML  SOLN

[Series 2: abd/pel with · axial · 0.79mm/px · z∈[-420,-50]mm · 13 of 84 slices shown, 17 images]
[im 5/84  soft-tissue]
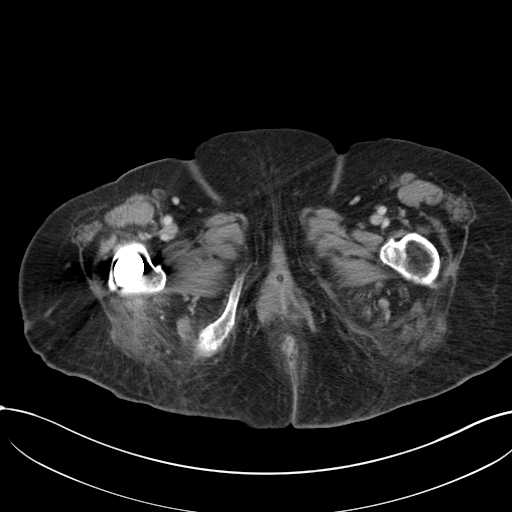
[im 5/84  bone]
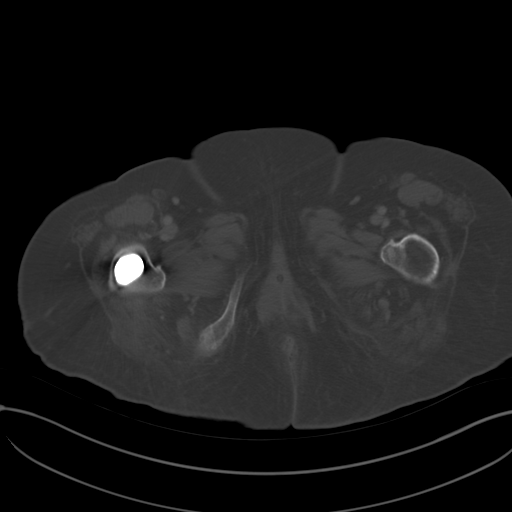
[im 13/84  soft-tissue]
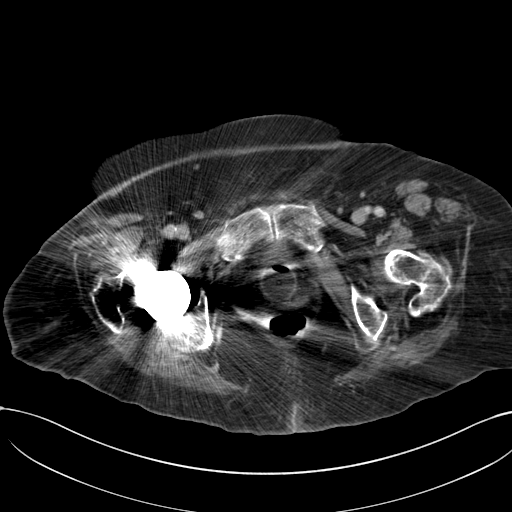
[im 21/84  soft-tissue]
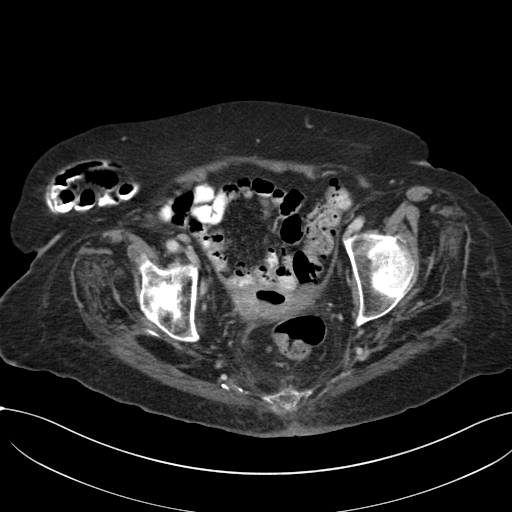
[im 30/84  soft-tissue]
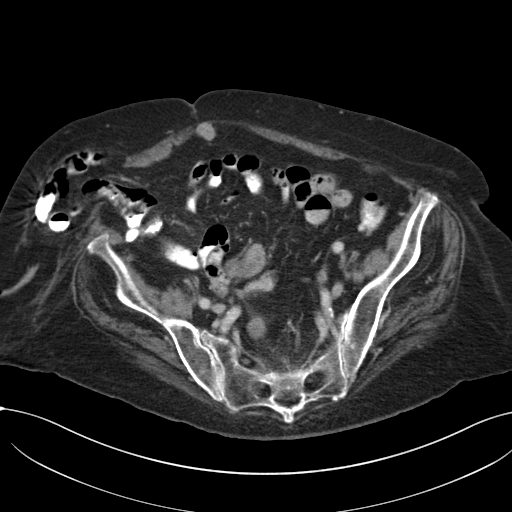
[im 34/84  soft-tissue]
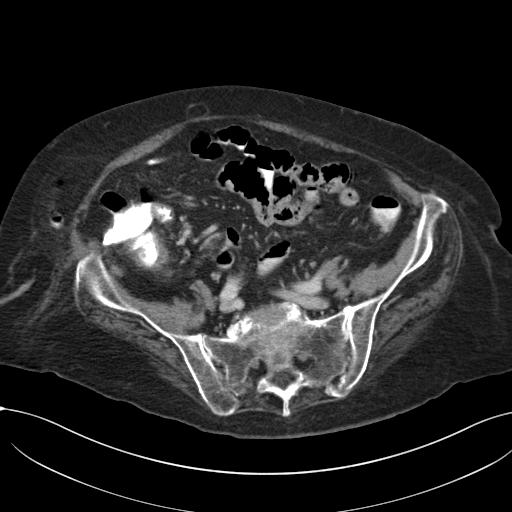
[im 42/84  soft-tissue]
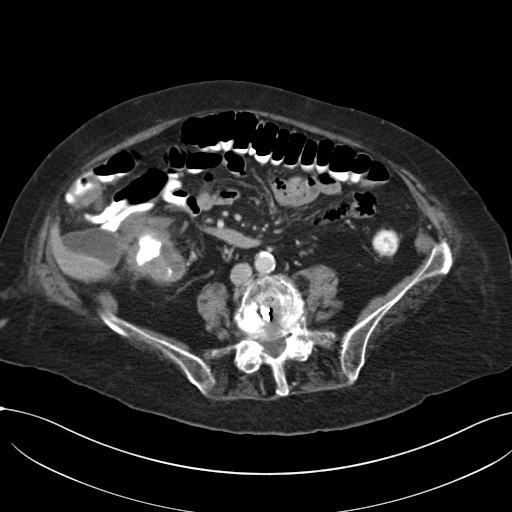
[im 50/84  soft-tissue]
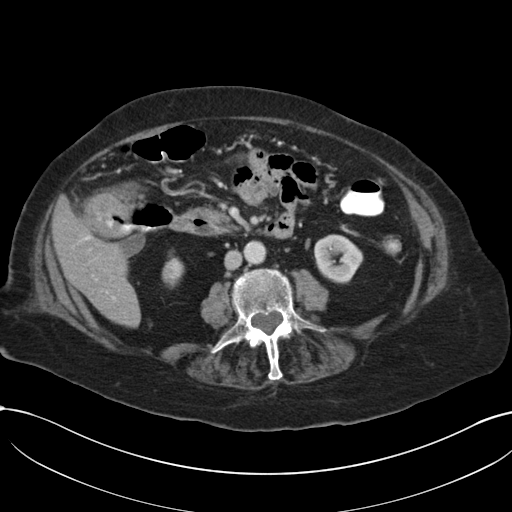
[im 54/84  soft-tissue]
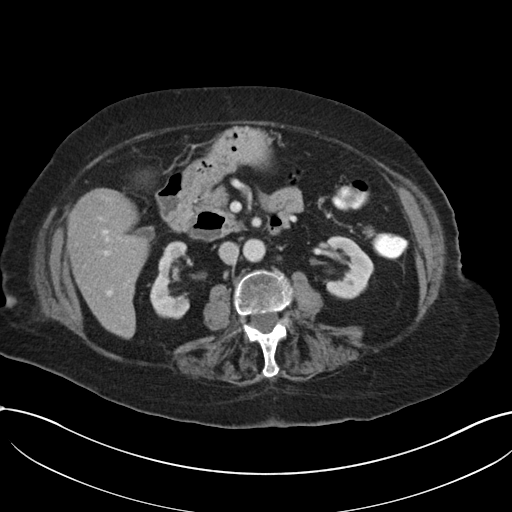
[im 63/84  soft-tissue]
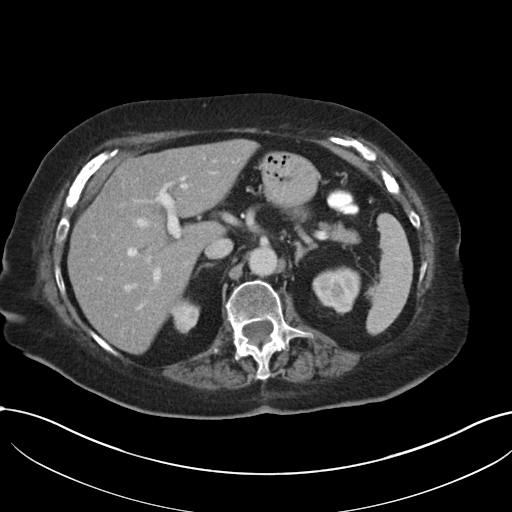
[im 63/84  bone]
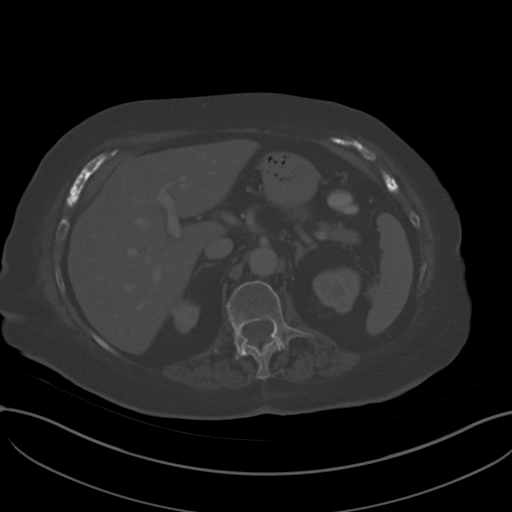
[im 67/84  lung]
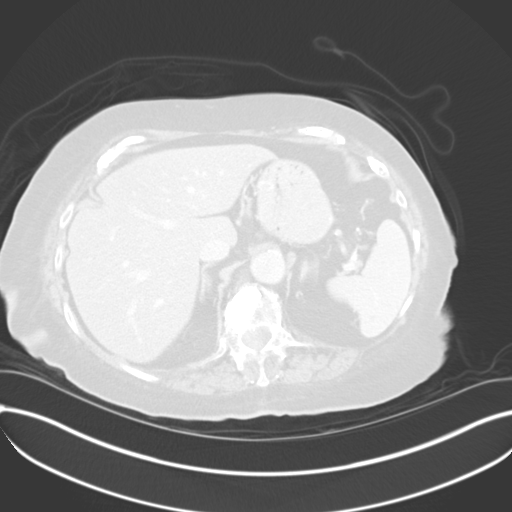
[im 71/84  soft-tissue]
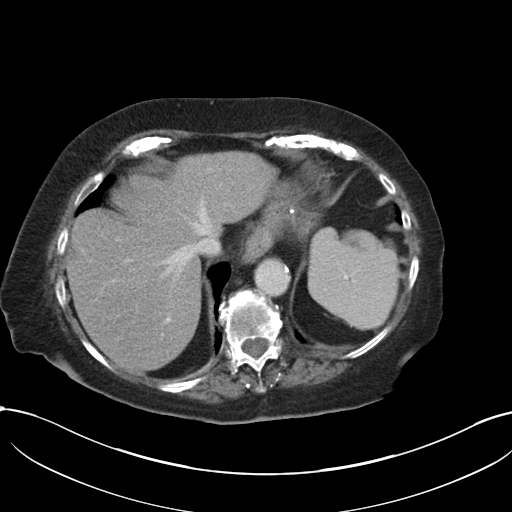
[im 71/84  lung]
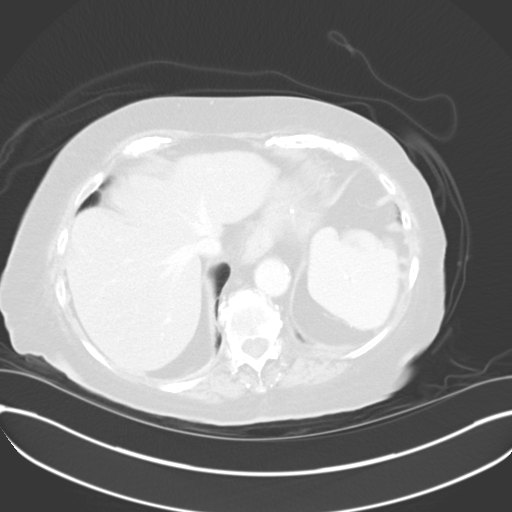
[im 75/84  lung]
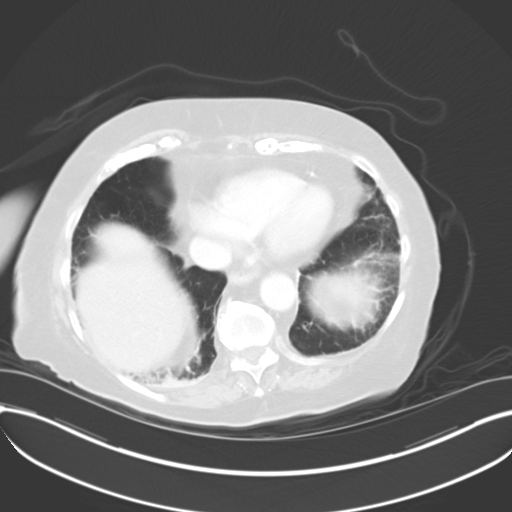
[im 79/84  soft-tissue]
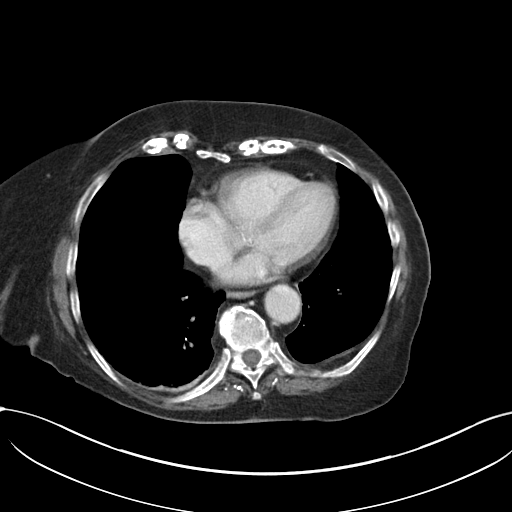
[im 79/84  lung]
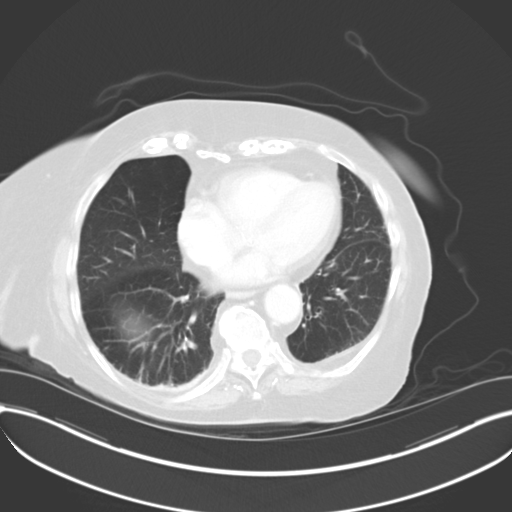

[13 of 32 positions shown; findings below may reference images not displayed]

FINDINGS: Lower Chest: Bibasilar interstitial thickening which is nonspecific
in this age group. Mild right base scarring. Cardiomegaly, without
pericardial or pleural effusion. A small hiatal hernia.

Abdomen/Pelvis: Mild hepatic steatosis, without focal liver lesion.
12 mm low-density splenic lesion on image 21 is nonspecific.

Underdistended proximal stomach. A duodenal diverticulum. Normal
pancreas, gallbladder, biliary tract, adrenal glands. Moderate
bilateral renal atrophy, greater on the right. Too small to
characterize left renal lesions, likely cysts.

No retroperitoneal or retrocrural adenopathy.

Moderately degraded evaluation of the pelvis secondary to beam
hardening artifact from right hip arthroplasty. Wall thickening
within the sigmoid colon on image 60/series 2. An adjacent
pericolonic nodule versus node of 1.7 cm on image 56/series 2.

Extensive colonic diverticulosis. Ascending colonic wall focal
moderate to marked thickening just inferior to the hepatic flexure
(image 38/series 2) and just above the ileocecal junction on image
45/ series 2. These may be contiguous or adjacent, when correlated
with coronal reformatted images. Neither area is obstructive. There
is adjacent moderate to marked adenopathy in the ileocolic
mesentery. An index node measures 1.9 cm on image 49.

No small bowel obstruction. Small bowel is positioned within a a
right lateral pelvic wall hernia.

Omental/peritoneal disease, including in omental nodule of 1.5 cm on
image 59 image 55/series 2. No ascites.

A Foley catheter is identified within the urinary bladder. The
sigmoid soft tissue thickening is adjacent to the vaginal cuff
(presuming prior hysterectomy). This area is extremely degraded due
to beam hardening artifact. No gross free pelvic fluid or adnexal
mass.

Bones/Musculoskeletal: Moderate to marked osteopenia. Vertebral
augmentation at L4, L5, and T12. Mild T11 compression deformity.
IMPRESSION: 1. Evaluation of the pelvis is moderate to severely degraded
secondary to beam hardening artifact from right hip arthroplasty.
Vaginal fistula cannot be excluded/evaluated.
2. There is however, ascending and sigmoid colonic marked wall
thickening. The ascending colonic wall thickening is favored to
represent primary colon carcinoma. Lymphoma would be an alternate
consideration, given absence of obstruction in the extent of
adjacent adenopathy. Sigmoid wall thickening is favored to represent
a synchronous carcinoma. Serosal based metastasis from the presumed
ascending primary would be an alternate consideration. Given the
extent of sigmoid wall thickening, and the position of the sigmoid
immediately adjacent to the vaginal cuff, this is likely the site of
the presumed fistula by clinical findings.
3. Nodal and omental/peritoneal metastasis throughout the abdomen.
4. Nonspecific low-density splenic lesion.
5. Nonobstructive small bowel containing right pelvic wall hernia.
